# Patient Record
Sex: Female | Born: 1975 | Race: Black or African American | Hispanic: No | Marital: Single | Smoking: Current every day smoker
Health system: Southern US, Community
[De-identification: ages and names within clinical notes are randomized; demographics above are authoritative.]

## PROBLEM LIST (undated history)

## (undated) DIAGNOSIS — N760 Acute vaginitis: Secondary | ICD-10-CM

## (undated) DIAGNOSIS — I10 Essential (primary) hypertension: Secondary | ICD-10-CM

## (undated) DIAGNOSIS — B9689 Other specified bacterial agents as the cause of diseases classified elsewhere: Secondary | ICD-10-CM

## (undated) HISTORY — DX: Acute vaginitis: N76.0

## (undated) HISTORY — DX: Other specified bacterial agents as the cause of diseases classified elsewhere: B96.89

---

## 1997-12-24 ENCOUNTER — Inpatient Hospital Stay (HOSPITAL_COMMUNITY): Admission: AD | Admit: 1997-12-24 | Discharge: 1997-12-24 | Payer: Self-pay | Admitting: *Deleted

## 1997-12-25 ENCOUNTER — Ambulatory Visit (HOSPITAL_COMMUNITY): Admission: RE | Admit: 1997-12-25 | Discharge: 1997-12-25 | Payer: Self-pay | Admitting: *Deleted

## 1998-02-24 ENCOUNTER — Inpatient Hospital Stay (HOSPITAL_COMMUNITY): Admission: AD | Admit: 1998-02-24 | Discharge: 1998-02-24 | Payer: Self-pay | Admitting: *Deleted

## 2002-04-07 ENCOUNTER — Other Ambulatory Visit: Admission: RE | Admit: 2002-04-07 | Discharge: 2002-04-07 | Payer: Self-pay | Admitting: Obstetrics and Gynecology

## 2002-04-17 ENCOUNTER — Emergency Department (HOSPITAL_COMMUNITY): Admission: EM | Admit: 2002-04-17 | Discharge: 2002-04-17 | Payer: Self-pay | Admitting: Emergency Medicine

## 2002-04-17 ENCOUNTER — Encounter: Payer: Self-pay | Admitting: Emergency Medicine

## 2003-01-01 ENCOUNTER — Encounter: Admission: RE | Admit: 2003-01-01 | Discharge: 2003-01-01 | Payer: Self-pay | Admitting: Obstetrics and Gynecology

## 2003-01-01 ENCOUNTER — Encounter: Payer: Self-pay | Admitting: Obstetrics and Gynecology

## 2003-08-03 ENCOUNTER — Other Ambulatory Visit: Admission: RE | Admit: 2003-08-03 | Discharge: 2003-08-03 | Payer: Self-pay | Admitting: Obstetrics and Gynecology

## 2006-06-22 ENCOUNTER — Inpatient Hospital Stay (HOSPITAL_COMMUNITY): Admission: AD | Admit: 2006-06-22 | Discharge: 2006-06-22 | Payer: Self-pay | Admitting: Family Medicine

## 2006-08-18 ENCOUNTER — Emergency Department (HOSPITAL_COMMUNITY): Admission: EM | Admit: 2006-08-18 | Discharge: 2006-08-18 | Payer: Self-pay | Admitting: Emergency Medicine

## 2008-08-31 DIAGNOSIS — B9689 Other specified bacterial agents as the cause of diseases classified elsewhere: Secondary | ICD-10-CM

## 2008-08-31 HISTORY — DX: Other specified bacterial agents as the cause of diseases classified elsewhere: B96.89

## 2009-04-29 ENCOUNTER — Ambulatory Visit: Payer: Self-pay | Admitting: Internal Medicine

## 2009-04-29 ENCOUNTER — Encounter: Payer: Self-pay | Admitting: Internal Medicine

## 2009-04-29 DIAGNOSIS — R197 Diarrhea, unspecified: Secondary | ICD-10-CM | POA: Insufficient documentation

## 2009-04-29 LAB — CONVERTED CEMR LAB
ALT: 9 units/L (ref 0–35)
AST: 13 units/L (ref 0–37)
Albumin: 4.1 g/dL (ref 3.5–5.2)
Alkaline Phosphatase: 53 units/L (ref 39–117)
BUN: 10 mg/dL (ref 6–23)
CO2: 22 meq/L (ref 19–32)
Calcium: 9.2 mg/dL (ref 8.4–10.5)
Chloride: 109 meq/L (ref 96–112)
Cholesterol: 178 mg/dL (ref 0–200)
Creatinine, Ser: 0.86 mg/dL (ref 0.40–1.20)
Glucose, Bld: 72 mg/dL (ref 70–99)
HDL: 36 mg/dL — ABNORMAL LOW (ref >39)
LDL Cholesterol: 129 mg/dL — ABNORMAL HIGH (ref 0–99)
Potassium: 4.7 meq/L (ref 3.5–5.3)
Sodium: 141 meq/L (ref 135–145)
Total Bilirubin: 0.5 mg/dL (ref 0.3–1.2)
Total CHOL/HDL Ratio: 4.9
Total Protein: 7 g/dL (ref 6.0–8.3)
Triglycerides: 65 mg/dL (ref <150)
VLDL: 13 mg/dL (ref 0–40)

## 2009-04-30 ENCOUNTER — Encounter: Payer: Self-pay | Admitting: Internal Medicine

## 2009-06-17 ENCOUNTER — Ambulatory Visit: Payer: Self-pay | Admitting: Internal Medicine

## 2009-06-17 ENCOUNTER — Encounter: Payer: Self-pay | Admitting: Internal Medicine

## 2009-06-17 DIAGNOSIS — N760 Acute vaginitis: Secondary | ICD-10-CM | POA: Insufficient documentation

## 2009-06-20 LAB — CONVERTED CEMR LAB
Candida species: NEGATIVE
Gardnerella vaginalis: POSITIVE — AB

## 2011-01-21 ENCOUNTER — Other Ambulatory Visit (HOSPITAL_COMMUNITY)
Admission: RE | Admit: 2011-01-21 | Discharge: 2011-01-21 | Disposition: A | Discharge status: Home / Self Care | Payer: Self-pay | Source: Ambulatory Visit | Attending: Internal Medicine | Admitting: Internal Medicine

## 2011-01-21 ENCOUNTER — Ambulatory Visit (INDEPENDENT_AMBULATORY_CARE_PROVIDER_SITE_OTHER): Payer: Self-pay | Admitting: Internal Medicine

## 2011-01-21 ENCOUNTER — Encounter: Payer: Self-pay | Admitting: Internal Medicine

## 2011-01-21 DIAGNOSIS — Z01419 Encounter for gynecological examination (general) (routine) without abnormal findings: Secondary | ICD-10-CM | POA: Insufficient documentation

## 2011-01-21 DIAGNOSIS — Z23 Encounter for immunization: Secondary | ICD-10-CM

## 2011-01-21 DIAGNOSIS — Z124 Encounter for screening for malignant neoplasm of cervix: Secondary | ICD-10-CM

## 2011-01-21 NOTE — Assessment & Plan Note (Addendum)
No complaints Pap done, f/u results CMET and lipids done 1 1/2 years ago.  Will defer until next visit to recheck.

## 2011-01-21 NOTE — Patient Instructions (Signed)
Follow up in 1 year for routine visit and Pap smear. If you have any problems before your next visit, call clinic for an appointment. Will give tetanus booster and do Pap smear, will notify you if there are any problems.

## 2011-01-21 NOTE — Assessment & Plan Note (Signed)
Reports last tetanus in 2002, will boost today

## 2011-01-21 NOTE — Progress Notes (Signed)
  Subjective:    Patient ID: Victoria Carpenter, female    DOB: 05-25-1976, 35 y.o.   MRN: [id number]  HPI Here for routine visit and Pap smear.  No complaints aside from occasional pain with wisdom tooth that resolves with ibuprofen.     Review of Systems  Constitutional: Negative for activity change, appetite change and unexpected weight change.  Respiratory: Negative for chest tightness and shortness of breath.   Cardiovascular: Negative for palpitations.  Genitourinary: Negative for dysuria, vaginal discharge, genital sores, vaginal pain and pelvic pain.       Objective:   Physical Exam  Constitutional: She is oriented to person, place, and time. She appears well-developed and well-nourished. No distress.  HENT:  Head: Normocephalic and atraumatic.  Eyes: Conjunctivae and EOM are normal. Pupils are equal, round, and reactive to light. Right eye exhibits no discharge. Left eye exhibits no discharge. No scleral icterus.  Neck: Neck supple. No JVD present.  Cardiovascular: Normal rate, regular rhythm and normal heart sounds.  Exam reveals no gallop and no friction rub.   No murmur heard. Pulmonary/Chest: Effort normal and breath sounds normal. She has no wheezes. She has no rales.  Abdominal: Bowel sounds are normal.  Genitourinary: Vaginal discharge found.       Small amount of white discharge noted in vault.  Cervical os red but not erythematous.  No tenderness, no lesions.  Musculoskeletal: She exhibits no edema.  Neurological: She is alert and oriented to person, place, and time.  Psychiatric: She has a normal mood and affect. Her behavior is normal. Judgment and thought content normal.          Assessment & Plan:

## 2013-10-12 ENCOUNTER — Emergency Department (HOSPITAL_COMMUNITY): Payer: Self-pay

## 2013-10-12 ENCOUNTER — Encounter (HOSPITAL_COMMUNITY): Payer: Self-pay | Admitting: Emergency Medicine

## 2013-10-12 ENCOUNTER — Emergency Department (HOSPITAL_COMMUNITY)
Admission: EM | Admit: 2013-10-12 | Discharge: 2013-10-12 | Disposition: A | Discharge status: Home / Self Care | Payer: Self-pay | Attending: Emergency Medicine | Admitting: Emergency Medicine

## 2013-10-12 DIAGNOSIS — F17200 Nicotine dependence, unspecified, uncomplicated: Secondary | ICD-10-CM | POA: Insufficient documentation

## 2013-10-12 DIAGNOSIS — R079 Chest pain, unspecified: Secondary | ICD-10-CM

## 2013-10-12 DIAGNOSIS — R110 Nausea: Secondary | ICD-10-CM | POA: Insufficient documentation

## 2013-10-12 DIAGNOSIS — Z8742 Personal history of other diseases of the female genital tract: Secondary | ICD-10-CM | POA: Insufficient documentation

## 2013-10-12 DIAGNOSIS — R1011 Right upper quadrant pain: Secondary | ICD-10-CM | POA: Insufficient documentation

## 2013-10-12 DIAGNOSIS — Z79899 Other long term (current) drug therapy: Secondary | ICD-10-CM | POA: Insufficient documentation

## 2013-10-12 DIAGNOSIS — R072 Precordial pain: Secondary | ICD-10-CM | POA: Insufficient documentation

## 2013-10-12 DIAGNOSIS — R0602 Shortness of breath: Secondary | ICD-10-CM | POA: Insufficient documentation

## 2013-10-12 DIAGNOSIS — R1013 Epigastric pain: Secondary | ICD-10-CM | POA: Insufficient documentation

## 2013-10-12 DIAGNOSIS — Z8619 Personal history of other infectious and parasitic diseases: Secondary | ICD-10-CM | POA: Insufficient documentation

## 2013-10-12 DIAGNOSIS — I10 Essential (primary) hypertension: Secondary | ICD-10-CM | POA: Insufficient documentation

## 2013-10-12 DIAGNOSIS — R12 Heartburn: Secondary | ICD-10-CM | POA: Insufficient documentation

## 2013-10-12 HISTORY — DX: Essential (primary) hypertension: I10

## 2013-10-12 LAB — COMPREHENSIVE METABOLIC PANEL
ALT: 6 U/L (ref 0–35)
AST: 11 U/L (ref 0–37)
Albumin: 3.4 g/dL — ABNORMAL LOW (ref 3.5–5.2)
Alkaline Phosphatase: 67 U/L (ref 39–117)
BUN: 8 mg/dL (ref 6–23)
CALCIUM: 8.7 mg/dL (ref 8.4–10.5)
CO2: 24 mEq/L (ref 19–32)
Chloride: 103 mEq/L (ref 96–112)
Creatinine, Ser: 0.82 mg/dL (ref 0.50–1.10)
GFR calc Af Amer: >90 mL/min (ref >90)
GFR calc non Af Amer: 89 mL/min — ABNORMAL LOW (ref >90)
Glucose, Bld: 107 mg/dL — ABNORMAL HIGH (ref 70–99)
POTASSIUM: 3.9 meq/L (ref 3.7–5.3)
Sodium: 137 mEq/L (ref 137–147)
TOTAL PROTEIN: 6.9 g/dL (ref 6.0–8.3)
Total Bilirubin: 0.4 mg/dL (ref 0.3–1.2)

## 2013-10-12 LAB — CBC
HCT: 37.1 % (ref 36.0–46.0)
Hemoglobin: 12.9 g/dL (ref 12.0–15.0)
MCH: 32 pg (ref 26.0–34.0)
MCHC: 34.8 g/dL (ref 30.0–36.0)
MCV: 92.1 fL (ref 78.0–100.0)
Platelets: 209 10*3/uL (ref 150–400)
RBC: 4.03 MIL/uL (ref 3.87–5.11)
RDW: 12.8 % (ref 11.5–15.5)
WBC: 8 10*3/uL (ref 4.0–10.5)

## 2013-10-12 LAB — LIPASE, BLOOD: LIPASE: 27 U/L (ref 11–59)

## 2013-10-12 LAB — POCT I-STAT TROPONIN I: Troponin i, poc: 0.01 ng/mL (ref 0.00–0.08)

## 2013-10-12 MED ORDER — GI COCKTAIL ~~LOC~~
30.0000 mL | Freq: Once | ORAL | Status: AC
  Administered 2013-10-12: 30 mL via ORAL
  Filled 2013-10-12: qty 30

## 2013-10-12 MED ORDER — FAMOTIDINE 20 MG PO TABS: 20.0000 mg | ORAL_TABLET | Freq: Two times a day (BID) | ORAL | Status: DC

## 2013-10-12 NOTE — ED Provider Notes (Signed)
Medical screening examination/treatment/procedure(s) were performed by non-physician practitioner and as supervising physician I was immediately available for consultation/collaboration.  Judy Goodenow, MD 10/12/13 0633 

## 2013-10-12 NOTE — ED Provider Notes (Signed)
CSN: [id number]     Arrival date & time 10/12/13  0222 History   First MD Initiated Contact with Patient 10/12/13 [phone]     Chief Complaint  Patient presents with  . Chest Pain     (Consider location/radiation/quality/duration/timing/severity/associated sxs/prior Treatment) HPI Comments: Patient is a 38 year old female with past medical history of hypertension who presents to the emergency department complaining of midepigastric pain radiating to her mid sternal region x2 days. Patient states pain began initially after eating, tried taking Pepto-Bismol with minimal relief. Pain eventually subsided on its own, however returned again Yesterday without eating and became constant. Patient describes her pain as a burning sensation, severe. Nothing in specific makes her pain worse or better. Admits to slight nausea. She had an episode of shortness of breath earlier today. Denies fever, chills, headache, dizziness, lightheadedness, vision changes, vomiting, diaphoresis. No family history of early heart disease. No history of blood clots.  Patient is a 38 y.o. female presenting with chest pain. The history is provided by the patient.  Chest Pain Associated symptoms: abdominal pain, nausea and shortness of breath     Past Medical History  Diagnosis Date  . Vaginal delivery     x 2  . Vaginitis due to Gardnerella vaginalis 2010  . Hypertension    History reviewed. No pertinent past surgical history. Family History  Problem Relation Age of Onset  . Hypertension Mother   . Hypertension Father    History  Substance Use Topics  . Smoking status: Current Every Day Smoker -- 0.50 packs/day    Types: Cigarettes  . Smokeless tobacco: Not on file     Comment: afraid she will gain weight  . Alcohol Use: No   OB History   Grav Para Term Preterm Abortions TAB SAB Ect Mult Living                 Review of Systems  Respiratory: Positive for shortness of breath.   Cardiovascular: Positive for  chest pain.  Gastrointestinal: Positive for nausea and abdominal pain.  All other systems reviewed and are negative.      Allergies  Review of patient's allergies indicates no known allergies.  Home Medications   Current Outpatient Rx  Name  Route  Sig  Dispense  Refill  . bismuth subsalicylate (PEPTO BISMOL) 262 MG/15ML suspension   Oral   Take 30 mLs by mouth every 6 (six) hours as needed.         Marland Kitchen ibuprofen (ADVIL,MOTRIN) 200 MG tablet   Oral   Take 200 mg by mouth every 6 (six) hours as needed.         . famotidine (PEPCID) 20 MG tablet   Oral   Take 1 tablet (20 mg total) by mouth 2 (two) times daily.   30 tablet   0    BP 148/96  Pulse 80  Temp(Src) 98.8 F (37.1 C) (Oral)  Resp 18  Ht 5\' 7"  (1.702 m)  Wt 170 lb (77.111 kg)  BMI 26.62 kg/m2  SpO2 99%  LMP 09/30/2013 Physical Exam  Nursing note and vitals reviewed. Constitutional: She is oriented to person, place, and time. She appears well-developed and well-nourished. No distress.  HENT:  Head: Normocephalic and atraumatic.  Mouth/Throat: Oropharynx is clear and moist.  Eyes: Conjunctivae and EOM are normal. Pupils are equal, round, and reactive to light.  Neck: Normal range of motion. Neck supple. No JVD present.  Cardiovascular: Normal rate, regular rhythm, normal heart sounds and intact  distal pulses.   No extremity edema. Equal distal pulses.  Pulmonary/Chest: Effort normal and breath sounds normal.  Abdominal: Soft. Normal appearance and bowel sounds are normal. She exhibits no distension. There is tenderness in the right upper quadrant and epigastric area. There is no rigidity, no rebound, no guarding and negative Murphy's sign.  Musculoskeletal: Normal range of motion. She exhibits no edema.  Neurological: She is alert and oriented to person, place, and time. She has normal strength. No sensory deficit.  Moves limbs without ataxia. Speech fluent, goal oriented. Equal grip strength.  Skin:  Skin is warm and dry. She is not diaphoretic.  Psychiatric: She has a normal mood and affect. Her behavior is normal.    ED Course  Procedures (including critical care time) Labs Review Labs Reviewed  COMPREHENSIVE METABOLIC PANEL - Abnormal; Notable for the following:    Glucose, Bld 107 (*)    Albumin 3.4 (*)    GFR calc non Af Amer 89 (*)    All other components within normal limits  CBC  LIPASE, BLOOD  POCT I-STAT TROPONIN I   Imaging Review Dg Chest 2 View  10/12/2013   CLINICAL DATA:  Upper chest pain.  EXAM: CHEST  2 VIEW  COMPARISON:  DG THORACIC SPINE dated 08/18/2006  FINDINGS: Cardiopericardial silhouette within normal limits. Mediastinal contours normal. Trachea midline. No airspace disease or effusion. No pneumothorax. Monitoring leads project over the chest.  IMPRESSION: No active cardiopulmonary disease.   Electronically Signed   By: Andreas NewportGeoffrey  Lamke M.D.   On: 10/12/2013 04:44   [place] Abdomen Complete  10/12/2013   CLINICAL DATA:  Epigastric pain radiating to the abdomen.  EXAM: ULTRASOUND ABDOMEN COMPLETE  COMPARISON:  None.  FINDINGS: Gallbladder:  Multiple small folds are present within the gallbladder. There are no stones. No sonographic Murphy sign, para cholecystic fluid or wall thickening.  Common bile duct:  Diameter: Normal at 3 mm.  Liver:  No focal lesion identified. Within normal limits in parenchymal echogenicity.  IVC:  No abnormality visualized.  Pancreas:  Visualized portion unremarkable.  Spleen:  6 cm.  Normal echotexture.  Right Kidney:  Length: 10.8 cm. Echogenicity within normal limits. No mass or hydronephrosis visualized.  Left Kidney:  Length: 10.0 cm. Echogenicity within normal limits. No mass or hydronephrosis visualized.  Abdominal aorta:  No aneurysm visualized.  Other findings:  None.  IMPRESSION: 1. Negative for cholelithiasis or cholecystitis. 2. No acute abnormality.   Electronically Signed   By: Andreas NewportGeoffrey  Lamke M.D.   On: 10/12/2013 05:32    EKG  Interpretation    Date/Time:  Thursday October 12 2013 02:32:18 EST Ventricular Rate:  77 PR Interval:  187 QRS Duration: 78 QT Interval:  368 QTC Calculation: 416 R Axis:   37 Text Interpretation:  Sinus rhythm Nonspecific ST abnormality No old tracing to compare Confirmed by OPITZ  MD, BRIAN ([id number]) on 10/12/2013 4:14:04 AM           Date: 10/12/2013  Rate: 77  Rhythm: normal sinus rhythm  QRS Axis: normal  Intervals: normal  ST/T Wave abnormalities: normal  Conduction Disutrbances:none  Narrative Interpretation: Sinus rhythm, probable left atrial enlargement, probable left ventricular hypertrophy  Old EKG Reviewed: none available    MDM   Final diagnoses:  Heartburn  Chest pain    Patient presenting with midepigastric pain and midsternal chest pain with associated nausea. She is well appearing and in no apparent distress. Afebrile, vital signs stable. EKG showing sinus rhythm, probable left  atrial enlargement, probable left ventricular hypertrophy. Patient tender midepigastric and right upper quadrant abdomen. No history of abdominal surgeries. Labs pending. Will obtain abdominal ultrasound to rule out gallbladder cause. Will do chest pain workup. 5:43 AM Labs, x-ray and ultrasound without acute finding. Patient reports some improvement of her symptoms with GI cocktail. Low risk heart score 1. PERC negative. Symptoms most likely from heartburn/indigestion. Pt stable for discharge home. Will discharge with pepcid. F/u with PCP. Return precautions given. Patient states understanding of treatment care plan and is agreeable.      Trevor Mace, PA-C 10/12/13 [phone]

## 2013-10-12 NOTE — ED Notes (Signed)
Pt c/o epigastric pain radiating to back x 2 days, intermittent with SHOB tonight. Denies n/v/d, describes as burning.

## 2013-10-12 NOTE — Discharge Instructions (Signed)
Take Pepcid as directed for indigestion. You may also use antacids.  Chest Pain (Nonspecific) It is often hard to give a specific diagnosis for the cause of chest pain. There is always a chance that your pain could be related to something serious, such as a heart attack or a blood clot in the lungs. You need to follow up with your caregiver for further evaluation. CAUSES   Heartburn.  Pneumonia or bronchitis.  Anxiety or stress.  Inflammation around your heart (pericarditis) or lung (pleuritis or pleurisy).  A blood clot in the lung.  A collapsed lung (pneumothorax). It can develop suddenly on its own (spontaneous pneumothorax) or from injury (trauma) to the chest.  Shingles infection (herpes zoster virus). The chest wall is composed of bones, muscles, and cartilage. Any of these can be the source of the pain.  The bones can be bruised by injury.  The muscles or cartilage can be strained by coughing or overwork.  The cartilage can be affected by inflammation and become sore (costochondritis). DIAGNOSIS  Lab tests or other studies, such as X-rays, electrocardiography, stress testing, or cardiac imaging, may be needed to find the cause of your pain.  TREATMENT   Treatment depends on what may be causing your chest pain. Treatment may include:  Acid blockers for heartburn.  Anti-inflammatory medicine.  Pain medicine for inflammatory conditions.  Antibiotics if an infection is present.  You may be advised to change lifestyle habits. This includes stopping smoking and avoiding alcohol, caffeine, and chocolate.  You may be advised to keep your head raised (elevated) when sleeping. This reduces the chance of acid going backward from your stomach into your esophagus.  Most of the time, nonspecific chest pain will improve within 2 to 3 days with rest and mild pain medicine. HOME CARE INSTRUCTIONS   If antibiotics were prescribed, take your antibiotics as directed. Finish them  even if you start to feel better.  For the next few days, avoid physical activities that bring on chest pain. Continue physical activities as directed.  Do not smoke.  Avoid drinking alcohol.  Only take over-the-counter or prescription medicine for pain, discomfort, or fever as directed by your caregiver.  Follow your caregiver's suggestions for further testing if your chest pain does not go away.  Keep any follow-up appointments you made. If you do not go to an appointment, you could develop lasting (chronic) problems with pain. If there is any problem keeping an appointment, you must call to reschedule. SEEK MEDICAL CARE IF:   You think you are having problems from the medicine you are taking. Read your medicine instructions carefully.  Your chest pain does not go away, even after treatment.  You develop a rash with blisters on your chest. SEEK IMMEDIATE MEDICAL CARE IF:   You have increased chest pain or pain that spreads to your arm, neck, jaw, back, or abdomen.  You develop shortness of breath, an increasing cough, or you are coughing up blood.  You have severe back or abdominal pain, feel nauseous, or vomit.  You develop severe weakness, fainting, or chills.  You have a fever. THIS IS AN EMERGENCY. Do not wait to see if the pain will go away. Get medical help at once. Call your local emergency services (911 in [place] Do not drive yourself to the hospital. MAKE SURE YOU:   Understand these instructions.  Will watch your condition.  Will get help right away if you are not doing well or get worse. Document Released:  05/27/2005 Document Revised: 11/09/2011 Document Reviewed: 03/22/2008 [facility]ExitCare Patient Information 2014 [place], [place].  Heartburn Heartburn is a painful, burning sensation in the chest. It may feel worse in certain positions, such as lying down or bending over. It is caused by stomach acid backing up into the tube that carries food from the mouth down to  the stomach (lower esophagus).  CAUSES   Large meals.  Certain foods and drinks.  Exercise.  Increased acid production.  Being overweight or obese.  Certain medicines. SYMPTOMS   Burning pain in the chest or lower throat.  Bitter taste in the mouth.  Coughing. DIAGNOSIS  If the usual treatments for heartburn do not improve your symptoms, then tests may be done to see if there is another condition present. Possible tests may include:  X-rays.  Endoscopy. This is when a tube with a light and a camera on the end is used to examine the esophagus and the stomach.  A test to measure the amount of acid in the esophagus (pH test).  A test to see if the esophagus is working properly (esophageal manometry).  Blood, breath, or stool tests to check for bacteria that cause ulcers. TREATMENT   Your caregiver may tell you to use certain over-the-counter medicines (antacids, acid reducers) for mild heartburn.  Your caregiver may prescribe medicines to decrease the acid in your stomach or protect your stomach lining.  Your caregiver may recommend certain diet changes.  For severe cases, your caregiver may recommend that the head of your bed be elevated on blocks. (Sleeping with more pillows is not an effective treatment as it only changes the position of your head and does not improve the main problem of stomach acid refluxing into the esophagus.) HOME CARE INSTRUCTIONS   Take all medicines as directed by your caregiver.  Raise the head of your bed by putting blocks under the legs if instructed to by your caregiver.  Do not exercise right after eating.  Avoid eating 2 or 3 hours before bed. Do not lie down right after eating.  Eat small meals throughout the day instead of 3 large meals.  Stop smoking if you smoke.  Maintain a healthy weight.  Identify foods and beverages that make your symptoms worse and avoid them. Foods you may want to avoid  include:  Peppers.  Chocolate.  High-fat foods, including fried foods.  Spicy foods.  Garlic and onions.  Citrus fruits, including oranges, grapefruit, lemons, and limes.  Food containing tomatoes or tomato products.  Mint.  Carbonated drinks, caffeinated drinks, and alcohol.  Vinegar. SEEK IMMEDIATE MEDICAL CARE IF:  You have severe chest pain that goes down your arm or into your jaw or neck.  You feel sweaty, dizzy, or lightheaded.  You are short of breath.  You vomit blood.  You have difficulty or pain with swallowing.  You have bloody or black, tarry stools.  You have episodes of heartburn more than 3 times a week for more than 2 weeks. MAKE SURE YOU:  Understand these instructions.  Will watch your condition.  Will get help right away if you are not doing well or get worse. Document Released: 01/03/2009 Document Revised: 11/09/2011 Document Reviewed: 02/01/2011 [facility] Patient Information 2014 [place], [place].

## 2015-02-20 ENCOUNTER — Ambulatory Visit: Payer: Self-pay | Admitting: Family Medicine

## 2015-06-07 ENCOUNTER — Ambulatory Visit (INDEPENDENT_AMBULATORY_CARE_PROVIDER_SITE_OTHER): Payer: Self-pay | Admitting: Family Medicine

## 2015-06-07 ENCOUNTER — Encounter: Payer: Self-pay | Admitting: Family Medicine

## 2015-06-07 VITALS — BP 158/98 | HR 86 | Temp 98.3°F | Resp 16 | Ht 66.0 in | Wt 201.0 lb

## 2015-06-07 DIAGNOSIS — Z0000 Encounter for general adult medical examination without abnormal findings: Secondary | ICD-10-CM

## 2015-06-07 DIAGNOSIS — F17200 Nicotine dependence, unspecified, uncomplicated: Secondary | ICD-10-CM

## 2015-06-07 DIAGNOSIS — I10 Essential (primary) hypertension: Secondary | ICD-10-CM

## 2015-06-07 LAB — POCT URINALYSIS DIP (DEVICE)
BILIRUBIN URINE: NEGATIVE
GLUCOSE, UA: NEGATIVE mg/dL
Ketones, ur: NEGATIVE mg/dL
Nitrite: NEGATIVE
Protein, ur: NEGATIVE mg/dL
Specific Gravity, Urine: 1.025 (ref 1.005–1.030)
UROBILINOGEN UA: 0.2 mg/dL (ref 0.0–1.0)
pH: 5.5 (ref 5.0–8.0)

## 2015-06-07 LAB — COMPLETE METABOLIC PANEL WITH GFR
ALK PHOS: 64 U/L (ref 33–115)
ALT: 9 U/L (ref 6–29)
AST: 11 U/L (ref 10–30)
Albumin: 4.3 g/dL (ref 3.6–5.1)
BUN: 9 mg/dL (ref 7–25)
CO2: 25 mmol/L (ref 20–31)
CREATININE: 0.76 mg/dL (ref 0.50–1.10)
Calcium: 9.2 mg/dL (ref 8.6–10.2)
Chloride: 104 mmol/L (ref 98–110)
GFR, Est African American: >89 mL/min (ref >=60)
GFR, Est Non African American: >89 mL/min (ref >=60)
Glucose, Bld: 75 mg/dL (ref 65–99)
Potassium: 4.6 mmol/L (ref 3.5–5.3)
Sodium: 138 mmol/L (ref 135–146)
Total Bilirubin: 0.5 mg/dL (ref 0.2–1.2)
Total Protein: 7.2 g/dL (ref 6.1–8.1)

## 2015-06-07 LAB — LIPID PANEL
CHOLESTEROL: 192 mg/dL (ref 125–200)
HDL: 37 mg/dL — ABNORMAL LOW (ref >=46)
LDL Cholesterol: 122 mg/dL (ref <130)
Total CHOL/HDL Ratio: 5.2 Ratio — ABNORMAL HIGH (ref <=5.0)
Triglycerides: 164 mg/dL — ABNORMAL HIGH (ref <150)
VLDL: 33 mg/dL — ABNORMAL HIGH (ref <30)

## 2015-06-07 LAB — HEMOGLOBIN A1C
Hgb A1c MFr Bld: 5.4 % (ref <5.7)
Mean Plasma Glucose: 108 mg/dL (ref <117)

## 2015-06-07 MED ORDER — AMLODIPINE BESYLATE 5 MG PO TABS: 5.0000 mg | ORAL_TABLET | Freq: Every day | ORAL | Status: DC

## 2015-06-07 NOTE — Progress Notes (Signed)
Subjective:    Patient ID: [name], female    DOB: 1976-06-25, 39 y.o.   MRN: [id number]  HPI Ms. Kimla Furth, a 39 year old female with a history of hypertension presents to establish care. She states that she was previously a patient of Deneen Harts, FNP at [facility] in [place], [place]. Patient states that she has not followed up due to insurance constraints and has not taken medications in several months. She maintains that she does not exercise or follow a low sodium diet. She states that she is an everyday tobacco smoker that smokes .5 packs per day. She currently denies headache, shortness of breath, dizziness, chest pains, fatigue, edema, or heart palpitations.   Past Medical History  Diagnosis Date  . Vaginal delivery     x 2  . Vaginitis due to Gardnerella vaginalis 2010  . Hypertension    Immunization History  Administered Date(s) Administered  . Td 01/21/2011   Social History   Social History  . Marital Status: Single    Spouse Name: N/A  . Number of Children: N/A  . Years of Education: N/A   Occupational History  . Not on file.   Social History Main Topics  . Smoking status: Current Every Day Smoker -- 0.50 packs/day    Types: Cigarettes  . Smokeless tobacco: Not on file     Comment: afraid she will gain weight  . Alcohol Use: No  . Drug Use: No  . Sexual Activity: Yes    Birth Control/ Protection: Condom   Other Topics Concern  . Not on file   Social History Narrative   Home Health, private sitter   Review of Systems  Constitutional: Negative.  Negative for fever and fatigue.  HENT: Negative.   Eyes: Negative.   Respiratory: Negative.   Cardiovascular: Negative.   Gastrointestinal: Negative.   Endocrine: Negative.   Genitourinary: Negative.   Musculoskeletal: Negative.   Skin: Negative.   Allergic/Immunologic: Negative.   Neurological: Negative.   Hematological: Negative.   Psychiatric/Behavioral: Negative.         Objective:   Physical Exam  Constitutional: She is oriented to person, place, and time. She appears well-developed and well-nourished.  HENT:  Head: Normocephalic and atraumatic.  Right Ear: External ear normal.  Left Ear: External ear normal.  Nose: Nose normal.  Mouth/Throat: Uvula is midline, oropharynx is clear and moist and mucous membranes are normal. Abnormal dentition. Dental caries present.  Eyes: Conjunctivae and EOM are normal. Pupils are equal, round, and reactive to light.  Neck: Normal range of motion. Neck supple.  Cardiovascular: Normal rate, regular rhythm, normal heart sounds and intact distal pulses.   Pulmonary/Chest: Effort normal and breath sounds normal.  Abdominal: Soft. Bowel sounds are normal.  Musculoskeletal: Normal range of motion.  Neurological: She is alert and oriented to person, place, and time. She has normal reflexes.  Skin: Skin is warm and dry.  Psychiatric: She has a normal mood and affect. Her behavior is normal. Judgment and thought content normal.         BP 158/98 mmHg  Pulse 86  Temp(Src) 98.3 F (36.8 C) (Oral)  Resp 16  Ht  (1.676 m)  Wt 201 lb (91.173 kg)  BMI 32.46 kg/m2  LMP 05/20/2015 Assessment & Plan:    1. Essential hypertension Blood pressure is above goal. Will start amlodipine today. Will follow up for hypertension in 1 month after starting medication. Recommend a lowfat, low carbohydrate diet divided  over 5-6 small meals, increase water intake to 6-8 glasses, and 150 minutes per week of cardiovascular exercise.  - amLODipine (NORVASC) 5 MG tablet; Take 1 tablet (5 mg total) by mouth daily.  Dispense: 30 tablet; Refill: 0 - POCT urinalysis dipstick - COMPLETE METABOLIC PANEL WITH GFR  2. Tobacco dependence Smoking cessation instruction/counseling given:  counseled patient on the dangers of tobacco use, advised patient to stop smoking, and reviewed strategies to maximize success  3. Routine health  maintenance Eye exam: 3 years ago. Recommend a yearly eye exam  Dental visit: greater than 2 year Pap smear: 2014, normal per patient Patient refuses vaccinations - Lipid Panel - Hemoglobin A1c - MM Digital Screening; Future    Massie Maroon, FNP

## 2015-06-07 NOTE — Patient Instructions (Signed)
DASH Eating Plan °DASH stands for "Dietary Approaches to Stop Hypertension." The DASH eating plan is a healthy eating plan that has been shown to reduce high blood pressure (hypertension). Additional health benefits may include reducing the risk of type 2 diabetes mellitus, heart disease, and stroke. The DASH eating plan may also help with weight loss. °WHAT DO I NEED TO KNOW ABOUT THE DASH EATING PLAN? °For the DASH eating plan, you will follow these general guidelines: °· Choose foods with a percent daily value for sodium of less than 5% (as listed on the food label). °· Use salt-free seasonings or herbs instead of table salt or sea salt. °· Check with your health care provider or pharmacist before using salt substitutes. °· Eat lower-sodium products, often labeled as "lower sodium" or "no salt added." °· Eat fresh foods. °· Eat more vegetables, fruits, and low-fat dairy products. °· Choose whole grains. Look for the word "whole" as the first word in the ingredient list. °· Choose fish and skinless chicken or turkey more often than red meat. Limit fish, poultry, and meat to 6 oz (170 g) each day. °· Limit sweets, desserts, sugars, and sugary drinks. °· Choose heart-healthy fats. °· Limit cheese to 1 oz (28 g) per day. °· Eat more home-cooked food and less restaurant, buffet, and fast food. °· Limit fried foods. °· Cook foods using methods other than frying. °· Limit canned vegetables. If you do use them, rinse them well to decrease the sodium. °· When eating at a restaurant, ask that your food be prepared with less salt, or no salt if possible. °WHAT FOODS CAN I EAT? °Seek help from a dietitian for individual calorie needs. °Grains °Whole grain or whole wheat bread. Brown rice. Whole grain or whole wheat pasta. Quinoa, bulgur, and whole grain cereals. Low-sodium cereals. Corn or whole wheat flour tortillas. Whole grain cornbread. Whole grain crackers. Low-sodium crackers. °Vegetables °Fresh or frozen vegetables  (raw, steamed, roasted, or grilled). Low-sodium or reduced-sodium tomato and vegetable juices. Low-sodium or reduced-sodium tomato sauce and paste. Low-sodium or reduced-sodium canned vegetables.  °Fruits °All fresh, canned (in natural juice), or frozen fruits. °Meat and Other Protein Products °Ground beef (85% or leaner), grass-fed beef, or beef trimmed of fat. Skinless chicken or turkey. Ground chicken or turkey. Pork trimmed of fat. All fish and seafood. Eggs. Dried beans, peas, or lentils. Unsalted nuts and seeds. Unsalted canned beans. °Dairy °Low-fat dairy products, such as skim or 1% milk, 2% or reduced-fat cheeses, low-fat ricotta or cottage cheese, or plain low-fat yogurt. Low-sodium or reduced-sodium cheeses. °Fats and Oils °Tub margarines without trans fats. Light or reduced-fat mayonnaise and salad dressings (reduced sodium). Avocado. Safflower, olive, or canola oils. Natural peanut or almond butter. °Other °Unsalted popcorn and pretzels. °The items listed above may not be a complete list of recommended foods or beverages. Contact your dietitian for more options. °WHAT FOODS ARE NOT RECOMMENDED? °Grains °White bread. White pasta. White rice. Refined cornbread. Bagels and croissants. Crackers that contain trans fat. °Vegetables °Creamed or fried vegetables. Vegetables in a cheese sauce. Regular canned vegetables. Regular canned tomato sauce and paste. Regular tomato and vegetable juices. °Fruits °Dried fruits. Canned fruit in light or heavy syrup. Fruit juice. °Meat and Other Protein Products °Fatty cuts of meat. Ribs, chicken wings, bacon, sausage, bologna, salami, chitterlings, fatback, hot dogs, bratwurst, and packaged luncheon meats. Salted nuts and seeds. Canned beans with salt. °Dairy °Whole or 2% milk, cream, half-and-half, and cream cheese. Whole-fat or sweetened yogurt. Full-fat   cheeses or blue cheese. Nondairy creamers and whipped toppings. Processed cheese, cheese spreads, or cheese  curds. °Condiments °Onion and garlic salt, seasoned salt, table salt, and sea salt. Canned and packaged gravies. Worcestershire sauce. Tartar sauce. Barbecue sauce. Teriyaki sauce. Soy sauce, including reduced sodium. Steak sauce. Fish sauce. Oyster sauce. Cocktail sauce. Horseradish. Ketchup and mustard. Meat flavorings and tenderizers. Bouillon cubes. Hot sauce. Tabasco sauce. Marinades. Taco seasonings. Relishes. °Fats and Oils °Butter, stick margarine, lard, shortening, ghee, and bacon fat. Coconut, palm kernel, or palm oils. Regular salad dressings. °Other °Pickles and olives. Salted popcorn and pretzels. °The items listed above may not be a complete list of foods and beverages to avoid. Contact your dietitian for more information. °WHERE CAN I FIND MORE INFORMATION? °[employer], [employer]: www.nhlbi.nih.gov/health/health-topics/topics/dash/ °  °This information is not intended to replace advice given to you by your health care provider. Make sure you discuss any questions you have with your health care provider. °  °Document Released: 08/06/2011 Document Revised: 09/07/2014 Document Reviewed: 06/21/2013 °Elsevier Interactive Patient Education ©2016 [employer] ° °Hypertension °Hypertension, commonly called high blood pressure, is when the force of blood pumping through your arteries is too strong. Your arteries are the blood vessels that carry blood from your heart throughout your body. A blood pressure reading consists of a higher number over a lower number, such as 110/72. The higher number (systolic) is the pressure inside your arteries when your heart pumps. The lower number (diastolic) is the pressure inside your arteries when your heart relaxes. Ideally you want your blood pressure below 120/80. °Hypertension forces your heart to work harder to pump blood. Your arteries may become narrow or stiff. Having untreated or uncontrolled hypertension can cause heart attack, stroke, kidney  disease, and other problems. °RISK FACTORS °Some risk factors for high blood pressure are controllable. Others are not.  °Risk factors you cannot control include:  °· Race. You may be at higher risk if you are African American. °· Age. Risk increases with age. °· Gender. Men are at higher risk than women before age 45 years. After age 65, women are at higher risk than men. °Risk factors you can control include: °· Not getting enough exercise or physical activity. °· Being overweight. °· Getting too much fat, sugar, calories, or salt in your diet. °· Drinking too much alcohol. °SIGNS AND SYMPTOMS °Hypertension does not usually cause signs or symptoms. Extremely high blood pressure (hypertensive crisis) may cause headache, anxiety, shortness of breath, and nosebleed. °DIAGNOSIS °To check if you have hypertension, your health care provider will measure your blood pressure while you are seated, with your arm held at the level of your heart. It should be measured at least twice using the same arm. Certain conditions can cause a difference in blood pressure between your right and left arms. A blood pressure reading that is higher than normal on one occasion does not mean that you need treatment. If it is not clear whether you have high blood pressure, you may be asked to return on a different day to have your blood pressure checked again. Or, you may be asked to monitor your blood pressure at home for 1 or more weeks. °TREATMENT °Treating high blood pressure includes making lifestyle changes and possibly taking medicine. Living a healthy lifestyle can help lower high blood pressure. You may need to change some of your habits. °Lifestyle changes may include: °· Following the DASH diet. This diet is high in fruits, vegetables, and whole   grains. It is low in salt, red meat, and added sugars. °· Keep your sodium intake below 2,300 mg per day. °· Getting at least 30-45 minutes of aerobic exercise at least 4 times per  week. °· Losing weight if necessary. °· Not smoking. °· Limiting alcoholic beverages. °· Learning ways to reduce stress. °Your health care provider may prescribe medicine if lifestyle changes are not enough to get your blood pressure under control, and if one of the following is true: °· You are 18-59 years of age and your systolic blood pressure is above 140. °· You are 60 years of age or older, and your systolic blood pressure is above 150. °· Your diastolic blood pressure is above 90. °· You have diabetes, and your systolic blood pressure is over 140 or your diastolic blood pressure is over 90. °· You have kidney disease and your blood pressure is above 140/90. °· You have heart disease and your blood pressure is above 140/90. °Your personal target blood pressure may vary depending on your medical conditions, your age, and other factors. °HOME CARE INSTRUCTIONS °· Have your blood pressure rechecked as directed by your health care provider.   °· Take medicines only as directed by your health care provider. Follow the directions carefully. Blood pressure medicines must be taken as prescribed. The medicine does not work as well when you skip doses. Skipping doses also puts you at risk for problems. °· Do not smoke.   °· Monitor your blood pressure at home as directed by your health care provider.  °SEEK MEDICAL CARE IF:  °· You think you are having a reaction to medicines taken. °· You have recurrent headaches or feel dizzy. °· You have swelling in your ankles. °· You have trouble with your vision. °SEEK IMMEDIATE MEDICAL CARE IF: °· You develop a severe headache or confusion. °· You have unusual weakness, numbness, or feel faint. °· You have severe chest or abdominal pain. °· You vomit repeatedly. °· You have trouble breathing. °MAKE SURE YOU:  °· Understand these instructions. °· Will watch your condition. °· Will get help right away if you are not doing well or get worse. °  °This information is not intended to  replace advice given to you by your health care provider. Make sure you discuss any questions you have with your health care provider. °  °Document Released: 08/17/2005 Document Revised: 01/01/2015 Document Reviewed: 06/09/2013 °Elsevier Interactive Patient Education ©2016 [employer] Inc. ° °

## 2015-06-09 DIAGNOSIS — F17200 Nicotine dependence, unspecified, uncomplicated: Secondary | ICD-10-CM | POA: Insufficient documentation

## 2015-07-04 ENCOUNTER — Telehealth: Payer: Self-pay | Admitting: Family Medicine

## 2015-07-08 ENCOUNTER — Ambulatory Visit: Payer: Self-pay | Admitting: Family Medicine

## 2015-07-16 ENCOUNTER — Ambulatory Visit (INDEPENDENT_AMBULATORY_CARE_PROVIDER_SITE_OTHER): Payer: Self-pay | Admitting: Family Medicine

## 2015-07-16 ENCOUNTER — Encounter: Payer: Self-pay | Admitting: Family Medicine

## 2015-07-16 VITALS — BP 152/94 | HR 61 | Temp 98.1°F | Resp 14 | Ht 68.0 in | Wt 205.0 lb

## 2015-07-16 DIAGNOSIS — I10 Essential (primary) hypertension: Secondary | ICD-10-CM

## 2015-07-16 DIAGNOSIS — F17200 Nicotine dependence, unspecified, uncomplicated: Secondary | ICD-10-CM

## 2015-07-16 LAB — POCT URINALYSIS DIP (DEVICE)
BILIRUBIN URINE: NEGATIVE
GLUCOSE, UA: NEGATIVE mg/dL
Ketones, ur: NEGATIVE mg/dL
LEUKOCYTES UA: NEGATIVE
Nitrite: NEGATIVE
Protein, ur: NEGATIVE mg/dL
Urobilinogen, UA: 1 mg/dL (ref 0.0–1.0)
pH: 6.5 (ref 5.0–8.0)

## 2015-07-16 MED ORDER — AMLODIPINE BESYLATE 10 MG PO TABS: 10.0000 mg | ORAL_TABLET | Freq: Every day | ORAL | Status: DC

## 2015-07-16 NOTE — Progress Notes (Signed)
Subjective:    Patient ID: Bernita BuffyWillette L Seufert, female    DOB: 1976-01-07, 39 y.o.   MRN: [id number]  Hypertension   Ms. Lenis DickinsonWillette Pasqua, a 39 year old female with a history of hypertension presents for a 1 month follow up after starting medication. She states that she has been consistently taking medication at noon. She works 3rd shift so she takes medication on awakening.  maintains that she does not exercise or follow a low sodium diet. She states that she is an everyday tobacco smoker that smokes .5 packs per day. She currently denies headache, shortness of breath, dizziness, chest pains, fatigue, edema, or heart palpitations.   Past Medical History  Diagnosis Date  . Vaginal delivery     x 2  . Vaginitis due to Gardnerella vaginalis 2010  . Hypertension    Immunization History  Administered Date(s) Administered  . Td 01/21/2011   Social History   Social History  . Marital Status: Single    Spouse Name: N/A  . Number of Children: N/A  . Years of Education: N/A   Occupational History  . Not on file.   Social History Main Topics  . Smoking status: Current Every Day Smoker -- 0.50 packs/day    Types: Cigarettes  . Smokeless tobacco: Not on file     Comment: afraid she will gain weight  . Alcohol Use: No  . Drug Use: No  . Sexual Activity: Yes    Birth Control/ Protection: Condom   Other Topics Concern  . Not on file   Social History Narrative   Home Health, private sitter   Review of Systems  Constitutional: Negative.  Negative for fever and fatigue.  HENT: Negative.   Eyes: Negative.   Respiratory: Negative.  Negative for wheezing.   Cardiovascular: Negative.   Gastrointestinal: Negative.   Endocrine: Negative.  Negative for polydipsia, polyphagia and polyuria.  Genitourinary: Negative.   Musculoskeletal: Negative.   Skin: Negative.   Allergic/Immunologic: Negative.  Negative for immunocompromised state.  Neurological: Negative.  Negative for  dizziness, tremors and weakness.  Hematological: Negative.   Psychiatric/Behavioral: Negative.        Objective:   Physical Exam  Constitutional: She is oriented to person, place, and time. She appears well-developed and well-nourished.  HENT:  Head: Normocephalic and atraumatic.  Right Ear: External ear normal.  Left Ear: External ear normal.  Nose: Nose normal.  Mouth/Throat: Uvula is midline, oropharynx is clear and moist and mucous membranes are normal. Abnormal dentition. Dental caries present.  Eyes: Conjunctivae and EOM are normal. Pupils are equal, round, and reactive to light.  Neck: Normal range of motion. Neck supple.  Cardiovascular: Normal rate, regular rhythm, normal heart sounds and intact distal pulses.   Pulmonary/Chest: Effort normal and breath sounds normal.  Abdominal: Soft. Bowel sounds are normal.  Musculoskeletal: Normal range of motion.  Neurological: She is alert and oriented to person, place, and time. She has normal reflexes.  Skin: Skin is warm and dry.  Psychiatric: She has a normal mood and affect. Her behavior is normal. Judgment and thought content normal.      BP 165/99 mmHg  Pulse 61  Temp(Src) 98.1 F (36.7 C) (Oral)  Resp 14  Ht 5\' 8"  (1.727 m)  Wt 205 lb (92.987 kg)  BMI 31.18 kg/m2  LMP 07/14/2015 Assessment & Plan:  1. Essential hypertension Blood pressure is above goal. Will increase Amlodipine to 10 mg today. . Will follow up for hypertension in 1 month  after starting medication. Recommend a lowfat, low carbohydrate diet divided over 5-6 small meals, increase water intake to 6-8 glasses, and 150 minutes per week of cardiovascular exercise.   - amLODipine (NORVASC) 10 MG tablet; Take 1 tablet (10 mg total) by mouth daily.  Dispense: 30 tablet; Refill: 0 - POCT urinalysis dipstick  2. Tobacco dependence Patient states that she has been cutting down on smoking since she started working 3rd shift. Smoking cessation instruction/counseling  given:  counseled patient on the dangers of tobacco use, advised patient to stop smoking, and reviewed strategies to maximize success  Massie Maroon, FNP

## 2015-09-16 ENCOUNTER — Ambulatory Visit: Payer: Self-pay | Admitting: Family Medicine

## 2017-09-22 ENCOUNTER — Ambulatory Visit: Payer: Self-pay | Admitting: Family Medicine

## 2017-09-23 ENCOUNTER — Encounter: Payer: Self-pay | Admitting: Family Medicine

## 2017-09-23 ENCOUNTER — Ambulatory Visit: Payer: BLUE CROSS/BLUE SHIELD | Admitting: Family Medicine

## 2017-09-23 VITALS — BP 144/98 | HR 84 | Temp 98.1°F | Resp 16 | Ht 67.0 in | Wt 208.0 lb

## 2017-09-23 DIAGNOSIS — I10 Essential (primary) hypertension: Secondary | ICD-10-CM

## 2017-09-23 DIAGNOSIS — E669 Obesity, unspecified: Secondary | ICD-10-CM

## 2017-09-23 DIAGNOSIS — Z114 Encounter for screening for human immunodeficiency virus [HIV]: Secondary | ICD-10-CM | POA: Diagnosis not present

## 2017-09-23 DIAGNOSIS — F17200 Nicotine dependence, unspecified, uncomplicated: Secondary | ICD-10-CM

## 2017-09-23 LAB — POCT URINALYSIS DIP (DEVICE)
Glucose, UA: NEGATIVE mg/dL
Leukocytes, UA: NEGATIVE
NITRITE: NEGATIVE
PH: 5.5 (ref 5.0–8.0)
Protein, ur: 30 mg/dL — AB
Specific Gravity, Urine: >=1.03 (ref 1.005–1.030)
Urobilinogen, UA: 1 mg/dL (ref 0.0–1.0)

## 2017-09-23 LAB — POCT GLYCOSYLATED HEMOGLOBIN (HGB A1C): Hemoglobin A1C: 5.4

## 2017-09-23 MED ORDER — AMLODIPINE BESYLATE 5 MG PO TABS: 5.0000 mg | ORAL_TABLET | Freq: Every day | ORAL | 5 refills | Status: DC

## 2017-09-23 MED FILL — ?AMLODIPINE BESYLATE 5 MG T: 5 MG | 30 days supply | Qty: 30 | Fill #0

## 2017-09-23 NOTE — Progress Notes (Signed)
.  Subjective:    Patient ID: Victoria Carpenter, female    DOB: Feb 10, 1976, 42 y.o.   MRN: [id number]  HPI Victoria Carpenter, a 42 year old female with a history of hypertension and obesity presents to establish care. Victoria Carpenter was previously a patient at this clinic, but has been lost to follow up for greater than 1 year. Patient says that she has been without antihypertensive medications. She has not been exercising routinely or following a low sodium diet. Body mass index is 32.58 kg/m. 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 Patient does not check blood pressures at home. Patient denies chest pain, dyspnea, fatigue, irregular heart beat, lower extremity edema, syncope and tachypnea.  Cardiovascular risk factors: obesity (BMI >= 30 kg/m2), sedentary lifestyle and smoking/ tobacco exposure.   Past Medical History:  Diagnosis Date  . Hypertension   . Vaginal delivery    x 2  . Vaginitis due to Gardnerella vaginalis 2010   Social History   Socioeconomic History  . Marital status: Single    Spouse name: Not on file  . Number of children: Not on file  . Years of education: Not on file  . Highest education level: Not on file  Social Needs  . Financial resource strain: Not on file  . Food insecurity - worry: Not on file  . Food insecurity - inability: Not on file  . Transportation needs - medical: Not on file  . Transportation needs - non-medical: Not on file  Occupational History  . Not on file  Tobacco Use  . Smoking status: Current Every Day Smoker    Packs/day: 0.25    Types: Cigarettes  . Smokeless tobacco: Never Used  . Tobacco comment: afraid she will gain weight  Substance and Sexual Activity  . Alcohol use: No  . Drug use: No  . Sexual activity: Yes    Birth control/protection: Condom  Other Topics Concern  . Not on file  Social History Narrative   Home Health, private sitter   Review of Systems  Constitutional:  Negative.   HENT: Negative.   Eyes: Negative.   Respiratory: Negative.   Cardiovascular: Negative.   Gastrointestinal: Negative.   Endocrine: Negative for polydipsia, polyphagia and polyuria.  Genitourinary: Negative.  Negative for menstrual problem.  Musculoskeletal: Negative.  Negative for arthralgias, back pain, gait problem, joint swelling and myalgias.  Skin: Negative.   Allergic/Immunologic: Negative.   Neurological: Negative.   Hematological: Negative.   Psychiatric/Behavioral: Negative.        Objective:   Physical Exam  HENT:  Head: Normocephalic and atraumatic.  Right Ear: External ear normal.  Left Ear: External ear normal.  Nose: Nose normal.  Mouth/Throat: Oropharynx is clear and moist.  Eyes: Conjunctivae and EOM are normal. Pupils are equal, round, and reactive to light.  Neck: Normal range of motion. Neck supple.  Cardiovascular: Normal rate, regular rhythm, normal heart sounds and intact distal pulses.  Pulmonary/Chest: Effort normal and breath sounds normal.  Abdominal: Soft. Bowel sounds are normal.  Skin: Skin is warm.  Psychiatric: She has a normal mood and affect. Her behavior is normal. Judgment and thought content normal.   BP (!) 144/98 (BP Location: Right Arm, Patient Position: Sitting, Cuff Size: Normal) Comment: manually  Pulse 84   Temp 98.1 F (36.7 C) (Oral)   Resp 16   Ht 5\' 7"  (1.702 m)   Wt 208 lb (94.3 kg)   LMP  08/30/2017   SpO2 100%   BMI 32.58 kg/m     Assessment & Plan:  1. Essential hypertension Blood pressure is above goal . Will start Amlodipine 5 mg daily. We have discussed target BP range and blood pressure goal. I have advised patient to check BP regularly and to call us back or report to clinic if the numbers are consistently higher than 140/90. We discussed the importance of compliance with medical therapy and DASH diet recommended, consequences of uncontrolled hypertension discussed.    - POCT urinalysis dip (device) -  Basic Metabolic Panel - amLODipine (NORVASC) 5 MG tablet; Take 1 tablet (5 mg total) by mouth daily.  Dispense: 30 tablet; Refill: 5  2. Obesity (BMI 30-39.9) Recommend a lowfat, low carbohydrate diet divided over 5-6 small meals, increase water intake to 6-8 glasses, and 150 minutes per week of cardiovascular exercise.   - HgB A1c - TSH  3. Tobacco dependence Smoking cessation instruction/counseling given:  counseled patient on the dangers of tobacco use, advised patient to stop smoking, and reviewed strategies to maximize success  4. Screening for HIV (human immunodeficiency virus) - HIV antibody (with reflex)   RTC: 3 months for hypertension and pap smear   Nolon NationsLachina Moore Jayleena Stille  MSN, FNP-C Patient Care [facility] [address]  [place], [place] [id number] [phone]

## 2017-09-23 NOTE — Patient Instructions (Signed)
Your blood pressure is above goal, will restart amlodipine 5 mg daily.  -  Continue medication, monitor blood pressure at home. Continue DASH diet. Reminder to go to the ER if any CP, SOB, nausea, dizziness, severe HA, changes vision/speech, left arm numbness and tingling and jaw pain.  Body mass index is 32.58 kg/m.  Recommend a low calorie, low-fat, low carbohydrate diet divided over 5-6 small meals throughout the day.  Also recommend a low impact cardiovascular exercise routine 30 minutes/day 5 days/week per AHA guidelines. DASH Eating Plan DASH stands for "Dietary Approaches to Stop Hypertension." The DASH eating plan is a healthy eating plan that has been shown to reduce high blood pressure (hypertension). It may also reduce your risk for type 2 diabetes, heart disease, and stroke. The DASH eating plan may also help with weight loss. What are tips for following this plan? General guidelines  Avoid eating more than 2,300 mg (milligrams) of salt (sodium) a day. If you have hypertension, you may need to reduce your sodium intake to 1,500 mg a day.  Limit alcohol intake to no more than 1 drink a day for nonpregnant women and 2 drinks a day for men. One drink equals 12 oz of beer, 5 oz of wine, or 1 oz of hard liquor.  Work with your health care provider to maintain a healthy body weight or to lose weight. Ask what an ideal weight is for you.  Get at least 30 minutes of exercise that causes your heart to beat faster (aerobic exercise) most days of the week. Activities may include walking, swimming, or biking.  Work with your health care provider or diet and nutrition specialist (dietitian) to adjust your eating plan to your individual calorie needs. Reading food labels  Check food labels for the amount of sodium per serving. Choose foods with less than 5 percent of the Daily Value of sodium. Generally, foods with less than 300 mg of sodium per serving fit into this eating plan.  To find  whole grains, look for the word "whole" as the first word in the ingredient list. Shopping  Buy products labeled as "low-sodium" or "no salt added."  Buy fresh foods. Avoid canned foods and premade or frozen meals. Cooking  Avoid adding salt when cooking. Use salt-free seasonings or herbs instead of table salt or sea salt. Check with your health care provider or pharmacist before using salt substitutes.  Do not fry foods. Cook foods using healthy methods such as baking, boiling, grilling, and broiling instead.  Cook with heart-healthy oils, such as olive, canola, soybean, or sunflower oil. Meal planning   Eat a balanced diet that includes: ? 5 or more servings of fruits and vegetables each day. At each meal, try to fill half of your plate with fruits and vegetables. ? Up to 6-8 servings of whole grains each day. ? Less than 6 oz of lean meat, poultry, or fish each day. A 3-oz serving of meat is about the same size as a deck of cards. One egg equals 1 oz. ? 2 servings of low-fat dairy each day. ? A serving of nuts, seeds, or beans 5 times each week. ? Heart-healthy fats. Healthy fats called Omega-3 fatty acids are found in foods such as flaxseeds and coldwater fish, like sardines, salmon, and mackerel.  Limit how much you eat of the following: ? Canned or prepackaged foods. ? Food that is high in trans fat, such as fried foods. ? Food that is high in saturated  fat, such as fatty meat. ? Sweets, desserts, sugary drinks, and other foods with added sugar. ? Full-fat dairy products.  Do not salt foods before eating.  Try to eat at least 2 vegetarian meals each week.  Eat more home-cooked food and less restaurant, buffet, and fast food.  When eating at a restaurant, ask that your food be prepared with less salt or no salt, if possible. What foods are recommended? The items listed may not be a complete list. Talk with your dietitian about what dietary choices are best for  you. Grains Whole-grain or whole-wheat bread. Whole-grain or whole-wheat pasta. Brown rice. [place]. Bulgur. Whole-grain and low-sodium cereals. Pita bread. Low-fat, low-sodium crackers. Whole-wheat flour tortillas. Vegetables Fresh or frozen vegetables (raw, steamed, roasted, or grilled). Low-sodium or reduced-sodium tomato and vegetable juice. Low-sodium or reduced-sodium tomato sauce and tomato paste. Low-sodium or reduced-sodium canned vegetables. Fruits All fresh, dried, or frozen fruit. Canned fruit in natural juice (without added sugar). Meat and other protein foods Skinless chicken or [place]. Ground chicken or [place]. Pork with fat trimmed off. Fish and seafood. Egg whites. Dried beans, peas, or lentils. Unsalted nuts, nut butters, and seeds. Unsalted canned beans. Lean cuts of beef with fat trimmed off. Low-sodium, lean deli meat. Dairy Low-fat (1%) or fat-free (skim) milk. Fat-free, low-fat, or reduced-fat cheeses. Nonfat, low-sodium ricotta or cottage cheese. Low-fat or nonfat yogurt. Low-fat, low-sodium cheese. Fats and oils Soft margarine without trans fats. Vegetable oil. Low-fat, reduced-fat, or light mayonnaise and salad dressings (reduced-sodium). Canola, safflower, olive, soybean, and sunflower oils. Avocado. Seasoning and other foods Herbs. Spices. Seasoning mixes without salt. Unsalted popcorn and pretzels. Fat-free sweets. What foods are not recommended? The items listed may not be a complete list. Talk with your dietitian about what dietary choices are best for you. Grains Baked goods made with fat, such as croissants, muffins, or some breads. Dry pasta or rice meal packs. Vegetables Creamed or fried vegetables. Vegetables in a cheese sauce. Regular canned vegetables (not low-sodium or reduced-sodium). Regular canned tomato sauce and paste (not low-sodium or reduced-sodium). Regular tomato and vegetable juice (not low-sodium or reduced-sodium). Angie Fava.  Olives. Fruits Canned fruit in a light or heavy syrup. Fried fruit. Fruit in cream or butter sauce. Meat and other protein foods Fatty cuts of meat. Ribs. Fried meat. Berniece Salines. Sausage. Bologna and other processed lunch meats. Salami. Fatback. Hotdogs. Bratwurst. Salted nuts and seeds. Canned beans with added salt. Canned or smoked fish. Whole eggs or egg yolks. Chicken or [place] with skin. Dairy Whole or 2% milk, cream, and half-and-half. Whole or full-fat cream cheese. Whole-fat or sweetened yogurt. Full-fat cheese. Nondairy creamers. Whipped toppings. Processed cheese and cheese spreads. Fats and oils Butter. Stick margarine. Lard. Shortening. Ghee. Bacon fat. Tropical oils, such as coconut, palm kernel, or palm oil. Seasoning and other foods Salted popcorn and pretzels. Onion salt, garlic salt, seasoned salt, table salt, and sea salt. Worcestershire sauce. Tartar sauce. Barbecue sauce. Teriyaki sauce. Soy sauce, including reduced-sodium. Steak sauce. Canned and packaged gravies. Fish sauce. Oyster sauce. Cocktail sauce. Horseradish that you find on the shelf. Ketchup. Mustard. Meat flavorings and tenderizers. Bouillon cubes. Hot sauce and Tabasco sauce. Premade or packaged marinades. Premade or packaged taco seasonings. Relishes. Regular salad dressings. Where to find more information:  National Heart, Lung, and [place]: https://wilson-eaton.com/  American Heart Association: www.heart.org Summary  The DASH eating plan is a healthy eating plan that has been shown to reduce high blood pressure (hypertension). It may also reduce  your risk for type 2 diabetes, heart disease, and stroke.  With the DASH eating plan, you should limit salt (sodium) intake to 2,300 mg a day. If you have hypertension, you may need to reduce your sodium intake to 1,500 mg a day.  When on the DASH eating plan, aim to eat more fresh fruits and vegetables, whole grains, lean proteins, low-fat dairy, and heart-healthy  fats.  Work with your health care provider or diet and nutrition specialist (dietitian) to adjust your eating plan to your individual calorie needs. This information is not intended to replace advice given to you by your health care provider. Make sure you discuss any questions you have with your health care provider. Document Released: 08/06/2011 Document Revised: 08/10/2016 Document Reviewed: 08/10/2016 Elsevier Interactive Patient Education  2018 [employer].  Exercising to [employer] Exercising can help you to lose weight. In order to lose weight through exercise, you need to do vigorous-intensity exercise. You can tell that you are exercising with vigorous intensity if you are breathing very hard and fast and cannot hold a conversation while exercising. Moderate-intensity exercise helps to maintain your current weight. You can tell that you are exercising at a moderate level if you have a higher heart rate and faster breathing, but you are still able to hold a conversation. How often should I exercise? Choose an activity that you enjoy and set realistic goals. Your health care provider can help you to make an activity plan that works for you. Exercise regularly as directed by your health care provider. This may include:  Doing resistance training twice each week, such as: ? Push-ups. ? Sit-ups. ? Lifting weights. ? Using resistance bands.  Doing a given intensity of exercise for a given amount of time. Choose from these options: ? 150 minutes of moderate-intensity exercise every week. ? 75 minutes of vigorous-intensity exercise every week. ? A mix of moderate-intensity and vigorous-intensity exercise every week.  Children, pregnant women, people who are out of shape, people who are overweight, and older adults may need to consult a health care provider for individual recommendations. If you have any sort of medical condition, be sure to consult your health care provider before  starting a new exercise program. What are some activities that can help me to lose weight?  Walking at a rate of at least 4.5 miles an hour.  Jogging or running at a rate of 5 miles per hour.  Biking at a rate of at least 10 miles per hour.  Lap swimming.  Roller-skating or in-line skating.  Cross-country skiing.  Vigorous competitive sports, such as football, basketball, and soccer.  Jumping rope.  Aerobic dancing. How can I be more active in my day-to-day activities?  Use the stairs instead of the elevator.  Take a walk during your lunch break.  If you drive, park your car farther away from work or school.  If you take public transportation, get off one stop early and walk the rest of the way.  Make all of your phone calls while standing up and walking around.  Get up, stretch, and walk around every 30 minutes throughout the day. What guidelines should I follow while exercising?  Do not exercise so much that you hurt yourself, feel dizzy, or get very short of breath.  Consult your health care provider prior to starting a new exercise program.  Wear comfortable clothes and shoes with good support.  Drink plenty of water while you exercise to prevent dehydration or heat  stroke. Body water is lost during exercise and must be replaced.  Work out until you breathe faster and your heart beats faster. This information is not intended to replace advice given to you by your health care provider. Make sure you discuss any questions you have with your health care provider. Document Released: 09/19/2010 Document Revised: 01/23/2016 Document Reviewed: 01/18/2014 Elsevier Interactive Patient Education  [employer]2018 Elsevier Inc.

## 2017-09-24 ENCOUNTER — Telehealth: Payer: Self-pay

## 2017-09-24 LAB — HIV ANTIBODY (ROUTINE TESTING W REFLEX): HIV SCREEN 4TH GENERATION: NONREACTIVE

## 2017-09-24 LAB — BASIC METABOLIC PANEL
BUN/Creatinine Ratio: 10 (ref 9–23)
BUN: 8 mg/dL (ref 6–24)
CALCIUM: 9.2 mg/dL (ref 8.7–10.2)
CO2: 21 mmol/L (ref 20–29)
Chloride: 105 mmol/L (ref 96–106)
Creatinine, Ser: 0.82 mg/dL (ref 0.57–1.00)
GFR calc Af Amer: 101 mL/min/{1.73_m2} (ref >59)
GFR calc non Af Amer: 88 mL/min/{1.73_m2} (ref >59)
Glucose: 86 mg/dL (ref 65–99)
Potassium: 5.1 mmol/L (ref 3.5–5.2)
Sodium: 142 mmol/L (ref 134–144)

## 2017-09-24 LAB — TSH: TSH: 0.891 u[IU]/mL (ref 0.450–4.500)

## 2017-09-24 NOTE — Telephone Encounter (Signed)
Called, no answer. Left a message for patient to return call. Thanks!  

## 2017-09-24 NOTE — Telephone Encounter (Signed)
-----   Message from Massie MaroonLachina M Hollis, [place] sent at 09/24/2017  9:32 AM EST ----- Regarding: lab results Please inform patient that all laboratory values are within a normal range. Please start Amlodipine 5 mg daily. Will follow up in 3 months as scheduled  Thanks

## 2017-09-24 NOTE — Telephone Encounter (Signed)
Patient returned call. I advised of normal labs and to start amlodipine 5mg  daily and to keep a 3 month follow up. Patient verbalized understanding. Thanks!

## 2017-10-29 MED FILL — AMLODIPINE BESYLATE 5 MG TA: 5 | 30 days supply | Qty: 30 | Fill #1

## 2017-11-30 MED FILL — AMLODIPINE BESYLATE 5 MG TA: 5 | 30 days supply | Qty: 30 | Fill #2

## 2017-12-09 ENCOUNTER — Encounter: Payer: Self-pay | Admitting: Family Medicine

## 2017-12-09 ENCOUNTER — Ambulatory Visit (INDEPENDENT_AMBULATORY_CARE_PROVIDER_SITE_OTHER): Payer: BLUE CROSS/BLUE SHIELD | Admitting: Family Medicine

## 2017-12-09 VITALS — BP 142/90 | HR 84 | Temp 98.2°F | Resp 16 | Ht 67.0 in | Wt 209.0 lb

## 2017-12-09 DIAGNOSIS — F17200 Nicotine dependence, unspecified, uncomplicated: Secondary | ICD-10-CM | POA: Diagnosis not present

## 2017-12-09 DIAGNOSIS — Z1231 Encounter for screening mammogram for malignant neoplasm of breast: Secondary | ICD-10-CM | POA: Diagnosis not present

## 2017-12-09 DIAGNOSIS — Z1239 Encounter for other screening for malignant neoplasm of breast: Secondary | ICD-10-CM

## 2017-12-09 DIAGNOSIS — Z01419 Encounter for gynecological examination (general) (routine) without abnormal findings: Secondary | ICD-10-CM

## 2017-12-09 DIAGNOSIS — N926 Irregular menstruation, unspecified: Secondary | ICD-10-CM | POA: Diagnosis not present

## 2017-12-09 DIAGNOSIS — N951 Menopausal and female climacteric states: Secondary | ICD-10-CM | POA: Diagnosis not present

## 2017-12-09 DIAGNOSIS — R8290 Unspecified abnormal findings in urine: Secondary | ICD-10-CM

## 2017-12-09 LAB — POCT URINALYSIS DIPSTICK
Bilirubin, UA: NEGATIVE
Blood, UA: NEGATIVE
GLUCOSE UA: NEGATIVE
Nitrite, UA: NEGATIVE
Protein, UA: NEGATIVE
Spec Grav, UA: 1.015 (ref 1.010–1.025)
Urobilinogen, UA: 1 E.U./dL
pH, UA: 6 (ref 5.0–8.0)

## 2017-12-09 LAB — POCT URINE PREGNANCY: PREG TEST UR: NEGATIVE

## 2017-12-09 NOTE — Patient Instructions (Addendum)
Kegel Exercises Kegel exercises help strengthen the muscles that support the rectum, vagina, small intestine, bladder, and uterus. Doing Kegel exercises can help:  Improve bladder and bowel control.  Improve sexual response.  Reduce problems and discomfort during pregnancy.  Kegel exercises involve squeezing your pelvic floor muscles, which are the same muscles you squeeze when you try to stop the flow of urine. The exercises can be done while sitting, standing, or lying down, but it is best to vary your position. Phase 1 exercises 1. Squeeze your pelvic floor muscles tight. You should feel a tight lift in your rectal area. If you are a female, you should also feel a tightness in your vaginal area. Keep your stomach, buttocks, and legs relaxed. 2. Hold the muscles tight for up to 10 seconds. 3. Relax your muscles. Repeat this exercise 50 times a day or as many times as told by your health care provider. Continue to do this exercise for at least 4-6 weeks or for as long as told by your health care provider. This information is not intended to replace advice given to you by your health care provider. Make sure you discuss any questions you have with your health care provider. Document Released: 08/03/2012 Document Revised: 04/11/2016 Document Reviewed: 07/07/2015 Elsevier Interactive Patient Education  [employer]2018 Elsevier Inc. Menopause is the time that marks the end of your menstrual cycles. It's diagnosed after you've gone 12 months without a menstrual period.  Menopause can happen in your 2840s or 2850s, but the average age is 3951 in the [place]. Menopause is a natural biological process. But the physical symptoms, such as hot flashes, and emotional symptoms of menopause may disrupt your sleep, lower your energy or affect emotional health.  There are many effective treatments available, from lifestyle adjustments to hormone therapy.  Cool hot flashes. Dress in layers, have a cold glass of  water or go somewhere cooler. Try to pinpoint what triggers your hot flashes. For many women, triggers may include hot beverages, caffeine, spicy foods, alcohol, stress, hot weather and even a warm room.  Decrease vaginal discomfort. Use over-the-counter, water-based vaginal lubricants (Astroglide, K-Y jelly, others), silicone-based lubricants or moisturizers (Replens, others). Choose products that don't contain glycerin, which can cause burning or irritation in women who are sensitive to that chemical. Staying sexually active also helps by increasing blood flow to the vagina.  Get enough sleep. Avoid caffeine, which can make it hard to get to sleep, and avoid drinking too much alcohol, which can interrupt sleep. Exercise during the day, although not right before bedtime. If hot flashes disturb your sleep, you may need to find a way to manage them before you can get adequate rest.  Practice relaxation techniques. Techniques such as deep breathing, paced breathing, guided imagery, massage and progressive muscle relaxation may help with menopausal symptoms. You can find a number of books, CDs and online offerings on different relaxation exercises.  Strengthen your pelvic floor. Pelvic floor muscle exercises, called Kegel exercises, can improve some forms of urinary incontinence.  Eat a balanced diet. Include a variety of fruits, vegetables and whole grains. Limit saturated fats, oils and sugars. Ask your provider if you need calcium or vitamin D supplements to help meet daily requirements.  Don't smoke. Smoking increases your risk of heart disease, stroke, osteoporosis, cancer and a range of other health problems. It may also increase hot flashes and bring on earlier menopause.  Exercise regularly. Get regular physical activity or exercise on  most days to help protect against heart disease, diabetes, osteoporosis and other conditions associated with aging.

## 2017-12-09 NOTE — Progress Notes (Signed)
Subjective:     Victoria Carpenter is a 42 y.o. female here for a routine gynecological exam. Patient says that it has been greater than 3 years since previous pap smear. Last pap smear was negative. Patient has had 2 vaginal births. She does not perform monthly self breast exams. She has not had a mammogram in several years. She denies a family history of breast cancer. She has not been sexually active in 2 months. She is a chronic everyday tobacco user and has a 20 year smoking history.  Past Medical History:  Diagnosis Date  . Hypertension   . Vaginal delivery    x 2  . Vaginitis due to Gardnerella vaginalis 2010   Immunization History  Administered Date(s) Administered  . Td 01/21/2011   Social History   Socioeconomic History  . Marital status: Single    Spouse name: Not on file  . Number of children: Not on file  . Years of education: Not on file  . Highest education level: Not on file  Occupational History  . Not on file  Social Needs  . Financial resource strain: Not on file  . Food insecurity:    Worry: Not on file    Inability: Not on file  . Transportation needs:    Medical: Not on file    Non-medical: Not on file  Tobacco Use  . Smoking status: Current Every Day Smoker    Packs/day: 0.25    Types: Cigarettes  . Smokeless tobacco: Never Used  . Tobacco comment: afraid she will gain weight  Substance and Sexual Activity  . Alcohol use: No  . Drug use: No  . Sexual activity: Yes    Birth control/protection: Condom  Lifestyle  . Physical activity:    Days per week: Not on file    Minutes per session: Not on file  . Stress: Not on file  Relationships  . Social connections:    Talks on phone: Not on file    Gets together: Not on file    Attends religious service: Not on file    Active member of club or organization: Not on file    Attends meetings of clubs or organizations: Not on file    Relationship status: Not on file  . Intimate partner violence:   Fear of current or ex partner: Not on file    Emotionally abused: Not on file    Physically abused: Not on file    Forced sexual activity: Not on file  Other Topics Concern  . Not on file  Social History Narrative   Home Health, private sitter      Gynecologic History Patient's last menstrual period was 11/24/2017. Obstetric History OB History  No data available      BP (!) 140/92 (BP Location: Left Arm, Patient Position: Sitting, Cuff Size: Normal)   Pulse 84   Temp 98.2 F (36.8 C) (Oral)   Resp 16   Ht 5\' 7"  (1.702 m)   Wt 209 lb (94.8 kg)   LMP 11/24/2017   SpO2 100%   BMI 32.73 kg/m  Review of Systems  Constitutional: Negative.   HENT: Negative.   Respiratory: Negative.   Cardiovascular: Negative.   Gastrointestinal: Negative.   Genitourinary: Negative.   Musculoskeletal: Negative.   Skin: Negative.   Neurological: Negative.   Psychiatric/Behavioral: Negative.     Objective:  Physical Exam  Constitutional: She is oriented to person, place, and time. She appears well-developed and well-nourished.  HENT:  Head:  Normocephalic and atraumatic.  Eyes: Pupils are equal, round, and reactive to light.  Neck: Normal range of motion.  Cardiovascular: Normal rate, regular rhythm, normal heart sounds and intact distal pulses.  Pulmonary/Chest: Effort normal and breath sounds normal.  Abdominal: Soft. Bowel sounds are normal.  Genitourinary: Uterus normal. Rectal exam shows guaiac negative stool. Pelvic exam was performed with patient prone. There is no rash, tenderness or lesion on the right labia. There is no rash, tenderness or lesion on the left labia. Cervix exhibits discharge. Cervix exhibits no motion tenderness and no friability. Right adnexum displays no mass, no tenderness and no fullness. Left adnexum displays no mass, no tenderness and no fullness. No tenderness in the vagina. Vaginal discharge found.  Neurological: She is alert and oriented to person, place,  and time.  Skin: Skin is warm and dry.  Psychiatric: She has a normal mood and affect. Her behavior is normal. Judgment and thought content normal.    Assessment:    Healthy female exam.    Plan:   1. Breast cancer screening Recommend monthly self breast exams - MM Digital Diagnostic Bilat; Future  2. Abnormal menses - POCT urine pregnancy  3. Perimenopausal Discussed menopausal symptoms at length. Patient endorses periodic hot flashes. Given written information on menopause  4. Pap test, as part of routine gynecological examination - IGP,CtNgTv,Apt HPV,rfx16/18,45 - Urinalysis Dipstick  5. Abnormal urinalysis - Urine Culture  6. Tobacco dependence Smoking cessation instruction/counseling given:  counseled patient on the dangers of tobacco use, advised patient to stop smoking, and reviewed strategies to maximize success   RTC; Will follow up by phone with laboratory results.   Nolon NationsLachina Moore Ofilia Rayon  MSN, FNP-C Patient Care [facility] [address]  [place], [place] [id number] [phone]

## 2017-12-11 LAB — URINE CULTURE

## 2017-12-12 ENCOUNTER — Other Ambulatory Visit: Payer: Self-pay | Admitting: Family Medicine

## 2017-12-12 DIAGNOSIS — A599 Trichomoniasis, unspecified: Secondary | ICD-10-CM

## 2017-12-12 LAB — IGP,CTNGTV,APT HPV,RFX16/18,45
CHLAMYDIA, NUC. ACID AMP: NEGATIVE
GONOCOCCUS, NUC. ACID AMP: NEGATIVE
HPV Aptima: NEGATIVE
PAP Smear Comment: 0
TRICH VAG BY NAA: POSITIVE — AB

## 2017-12-12 MED ORDER — METRONIDAZOLE 500 MG PO TABS: 500.0000 mg | ORAL_TABLET | Freq: Two times a day (BID) | ORAL | 0 refills | Status: DC

## 2017-12-12 NOTE — Progress Notes (Signed)
Meds ordered this encounter  Medications  . metroNIDAZOLE (FLAGYL) 500 MG tablet    Sig: Take 1 tablet (500 mg total) by mouth 2 (two) times daily.    Dispense:  14 tablet    Refill:  0     [name]  MSN, FNP-C [facility] Care [facility] [address]  [place], [place] [address] [phone]  

## 2017-12-13 ENCOUNTER — Telehealth: Payer: Self-pay

## 2017-12-13 MED FILL — metroNIDAZOLE 500 MG TABS: 500 | 7 days supply | Qty: 14 | Fill #0

## 2017-12-13 NOTE — Telephone Encounter (Signed)
Patient called back and I advised that sample was positive for trichomonas vaginitis which is a sexually transmitted. Advised that she would need to start metronidazole 500 mg twice daily with 7 days. Advised that pap smear was negative and does not need to be repeated for 10 years. Thanks!

## 2017-12-13 NOTE — Telephone Encounter (Signed)
-----   Message from Massie MaroonLachina M Hollis, [place] sent at 12/12/2017  3:45 PM EDT ----- Regarding: lab results Please inform patient that vaginal sample was positive for trichomonas vaginitis, which is parasite that is transmitted sexually.  Will start Metronidazole 500 mg BID for 7 days. Refrain from alcohol while taking metronidazole. Pap smear is negative, will repeat in 10 years per current guidelines.   Nolon NationsLachina Moore Hollis  MSN, FNP-C Patient Care [facility] [address]  [place], [place] [address] [phone]

## 2017-12-13 NOTE — Telephone Encounter (Signed)
Called, no answer. Left a message for patient to call back. Thanks!  

## 2017-12-16 ENCOUNTER — Other Ambulatory Visit: Payer: Self-pay | Admitting: Family Medicine

## 2017-12-16 DIAGNOSIS — Z1231 Encounter for screening mammogram for malignant neoplasm of breast: Secondary | ICD-10-CM

## 2017-12-22 ENCOUNTER — Ambulatory Visit: Payer: Self-pay | Admitting: Family Medicine

## 2017-12-31 ENCOUNTER — Ambulatory Visit
Admission: RE | Admit: 2017-12-31 | Discharge: 2017-12-31 | Disposition: A | Discharge status: Home / Self Care | Payer: BLUE CROSS/BLUE SHIELD | Source: Ambulatory Visit | Attending: Family Medicine | Admitting: Family Medicine

## 2017-12-31 DIAGNOSIS — Z1231 Encounter for screening mammogram for malignant neoplasm of breast: Secondary | ICD-10-CM

## 2018-01-03 ENCOUNTER — Other Ambulatory Visit: Payer: Self-pay | Admitting: Family Medicine

## 2018-01-03 DIAGNOSIS — R928 Other abnormal and inconclusive findings on diagnostic imaging of breast: Secondary | ICD-10-CM

## 2018-01-10 ENCOUNTER — Ambulatory Visit
Admission: RE | Admit: 2018-01-10 | Discharge: 2018-01-10 | Disposition: A | Discharge status: Home / Self Care | Payer: BLUE CROSS/BLUE SHIELD | Source: Ambulatory Visit | Attending: Family Medicine | Admitting: Family Medicine

## 2018-01-10 ENCOUNTER — Other Ambulatory Visit: Payer: Self-pay | Admitting: Family Medicine

## 2018-01-10 DIAGNOSIS — R928 Other abnormal and inconclusive findings on diagnostic imaging of breast: Secondary | ICD-10-CM

## 2018-01-10 DIAGNOSIS — N6489 Other specified disorders of breast: Secondary | ICD-10-CM

## 2018-01-11 ENCOUNTER — Ambulatory Visit: Payer: BLUE CROSS/BLUE SHIELD

## 2018-03-10 ENCOUNTER — Ambulatory Visit: Payer: BLUE CROSS/BLUE SHIELD | Admitting: Family Medicine

## 2019-03-01 ENCOUNTER — Telehealth: Payer: Self-pay

## 2019-03-01 NOTE — Telephone Encounter (Signed)
Called and spoke with patient for COVID 19 Screening. Patient had no risk factors and is cleared to come into office for appointment. Thanks! 

## 2019-03-02 ENCOUNTER — Other Ambulatory Visit: Payer: Self-pay

## 2019-03-02 ENCOUNTER — Ambulatory Visit (INDEPENDENT_AMBULATORY_CARE_PROVIDER_SITE_OTHER): Payer: Self-pay | Admitting: Family Medicine

## 2019-03-02 ENCOUNTER — Encounter: Payer: Self-pay | Admitting: Family Medicine

## 2019-03-02 VITALS — BP 152/98 | HR 80 | Temp 98.6°F | Resp 16 | Ht 64.25 in | Wt 208.0 lb

## 2019-03-02 DIAGNOSIS — N939 Abnormal uterine and vaginal bleeding, unspecified: Secondary | ICD-10-CM

## 2019-03-02 DIAGNOSIS — I10 Essential (primary) hypertension: Secondary | ICD-10-CM

## 2019-03-02 LAB — POCT URINALYSIS DIPSTICK
Bilirubin, UA: NEGATIVE
Blood, UA: NEGATIVE
Glucose, UA: NEGATIVE
Nitrite, UA: NEGATIVE
Protein, UA: NEGATIVE
Spec Grav, UA: 1.02 (ref 1.010–1.025)
Urobilinogen, UA: 1 E.U./dL
pH, UA: 5.5 (ref 5.0–8.0)

## 2019-03-02 MED ORDER — AMLODIPINE BESYLATE 5 MG PO TABS: 5.0000 mg | ORAL_TABLET | Freq: Every day | ORAL | 3 refills | Status: DC

## 2019-03-02 MED FILL — AMLODIPINE BESYLATE 5 MG TA: 5 | 90 days supply | Qty: 90 | Fill #0

## 2019-03-02 NOTE — Progress Notes (Signed)
[facility]  New Patient Encounter Provider: Lanae Boast, [place]  WIO:[id number]  DOB - [birth year]  SUBJECTIVE:   Victoria Carpenter, is a 43 y.o. female who presents to re-establish care with this clinic. Patient previously seen at this clinic and has not had medication in over a year. She denies eating a low sodium diet or exercising. She also reports having abnormal vaginal bleeding that lasted longer than her normal period. She reports being sexually active with one female partner with out barrier methods. She states that the bleeding has now resolved. Denies possibility of STIs.   No Known Allergies Past Medical History:  Diagnosis Date  . Hypertension   . Vaginal delivery    x 2  . Vaginitis due to Gardnerella vaginalis 2010   Current Outpatient Medications on File Prior to Visit  Medication Sig Dispense Refill  . ibuprofen (ADVIL,MOTRIN) 200 MG tablet Take 200 mg by mouth every 6 (six) hours as needed.     No current facility-administered medications on file prior to visit.    Family History  Problem Relation Age of Onset  . Hypertension Mother   . Hypertension Father    Social History   Socioeconomic History  . Marital status: Single    Spouse name: Not on file  . Number of children: Not on file  . Years of education: Not on file  . Highest education level: Not on file  Occupational History  . Not on file  Social Needs  . Financial resource strain: Not on file  . Food insecurity    Worry: Not on file    Inability: Not on file  . Transportation needs    Medical: Not on file    Non-medical: Not on file  Tobacco Use  . Smoking status: Current Every Day Smoker    Packs/day: 0.25    Types: Cigarettes  . Smokeless tobacco: Never Used  . Tobacco comment: afraid she will gain weight  Substance and Sexual Activity  . Alcohol use: No  . Drug use: No  . Sexual activity: Yes    Birth  control/protection: Condom  Lifestyle  . Physical activity    Days per week: Not on file    Minutes per session: Not on file  . Stress: Not on file  Relationships  . Social Herbalist on phone: Not on file    Gets together: Not on file    Attends religious service: Not on file    Active member of club or organization: Not on file    Attends meetings of clubs or organizations: Not on file    Relationship status: Not on file  . Intimate partner violence    Fear of current or ex partner: Not on file    Emotionally abused: Not on file    Physically abused: Not on file    Forced sexual activity: Not on file  Other Topics Concern  . Not on file  Social History Narrative   Home Health, private sitter    Review of Systems  Constitutional: Negative.   HENT: Negative.   Eyes: Negative.   Respiratory: Negative.   Cardiovascular: Negative.   Gastrointestinal: Negative.   Genitourinary: Negative.   Musculoskeletal: Negative.   Skin: Negative.   Neurological: Negative.   Psychiatric/Behavioral: Negative.      OBJECTIVE:    BP (!) 152/98 (BP Location: Left Arm, Patient Position: Sitting, Cuff Size: Normal) Comment: manually  Pulse 80   Temp 98.6 F (37 C) (Oral)   Resp 16   Ht 5' 4.25" (1.632 m)   Wt 208 lb (94.3 kg)   LMP 02/16/2019   SpO2 100%   BMI 35.43 kg/m   Physical Exam  Constitutional: She is oriented to person, place, and time and well-developed, well-nourished, and in no distress. No distress.  HENT:  Head: Normocephalic and atraumatic.  Eyes: Pupils are equal, round, and reactive to light. Conjunctivae and EOM are normal.  Neck: Normal range of motion. Neck supple.  Cardiovascular: Normal rate, regular rhythm and intact distal pulses. Exam reveals no gallop and no friction rub.  No murmur heard. Pulmonary/Chest: Effort normal and breath sounds normal. No respiratory distress. She has no wheezes.  Abdominal: Soft. Bowel sounds are normal. There is  no abdominal tenderness.  Musculoskeletal: Normal range of motion.        General: No tenderness or edema.  Lymphadenopathy:    She has no cervical adenopathy.  Neurological: She is alert and oriented to person, place, and time. Gait normal.  Skin: Skin is warm and dry.  Psychiatric: Mood, memory, affect and judgment normal.  Nursing note and vitals reviewed.    ASSESSMENT/PLAN:  1. Essential hypertension Restart medications. The patient is asked to make an attempt to improve diet and exercise patterns to aid in medical management of this problem.  - Urinalysis Dipstick - amLODipine (NORVASC) 5 MG tablet; Take 1 tablet (5 mg total) by mouth daily.  Dispense: 90 tablet; Refill: 3 - Comprehensive metabolic panel - Lipid Panel - Vitamin D, 25-hydroxy  2. Abnormal vaginal bleeding Resolved. Discussed possible causes that include pre-menopause, STIs, stress, and hormones.  - Chlamydia/GC NAA, Confirmation - TSH - CBC with Differential    Return in about 3 months (around 06/02/2019), or 2 weeks BP check, for htn.  The patient was given clear instructions to go to ER or return to medical center if symptoms don't improve, worsen or new problems develop. The patient verbalized understanding. The patient was told to call to get lab results if they haven't heard anything in the next week.     This note has been created with Education officer, environmentalDragon speech recognition software and smart phrase technology. Any transcriptional errors are unintentional.   [name], FNP-BC [facility] [address]  [place], [place] [id number] [phone]

## 2019-03-03 LAB — CBC WITH DIFFERENTIAL/PLATELET
Basophils Absolute: 0.1 10*3/uL (ref 0.0–0.2)
Basos: 1 %
EOS (ABSOLUTE): 0.2 10*3/uL (ref 0.0–0.4)
Eos: 3 %
Hematocrit: 38.5 % (ref 34.0–46.6)
Hemoglobin: 12.8 g/dL (ref 11.1–15.9)
Immature Grans (Abs): 0 10*3/uL (ref 0.0–0.1)
Immature Granulocytes: 0 %
Lymphocytes Absolute: 3 10*3/uL (ref 0.7–3.1)
Lymphs: 44 %
MCH: 29 pg (ref 26.6–33.0)
MCHC: 33.2 g/dL (ref 31.5–35.7)
MCV: 87 fL (ref 79–97)
Monocytes Absolute: 0.3 10*3/uL (ref 0.1–0.9)
Monocytes: 5 %
Neutrophils Absolute: 3.1 10*3/uL (ref 1.4–7.0)
Neutrophils: 47 %
Platelets: 291 10*3/uL (ref 150–450)
RBC: 4.42 x10E6/uL (ref 3.77–5.28)
RDW: 13.7 % (ref 11.7–15.4)
WBC: 6.7 10*3/uL (ref 3.4–10.8)

## 2019-03-03 LAB — COMPREHENSIVE METABOLIC PANEL
ALT: 7 IU/L (ref 0–32)
AST: 9 IU/L (ref 0–40)
Albumin/Globulin Ratio: 1.4 (ref 1.2–2.2)
Albumin: 4 g/dL (ref 3.8–4.8)
Alkaline Phosphatase: 72 IU/L (ref 39–117)
BUN/Creatinine Ratio: 8 — ABNORMAL LOW (ref 9–23)
BUN: 7 mg/dL (ref 6–24)
Bilirubin Total: 0.3 mg/dL (ref 0.0–1.2)
CO2: 22 mmol/L (ref 20–29)
Calcium: 9.3 mg/dL (ref 8.7–10.2)
Chloride: 104 mmol/L (ref 96–106)
Creatinine, Ser: 0.91 mg/dL (ref 0.57–1.00)
GFR calc Af Amer: 88 mL/min/{1.73_m2} (ref >59)
GFR calc non Af Amer: 76 mL/min/{1.73_m2} (ref >59)
Globulin, Total: 2.8 g/dL (ref 1.5–4.5)
Glucose: 81 mg/dL (ref 65–99)
Potassium: 4.6 mmol/L (ref 3.5–5.2)
Sodium: 139 mmol/L (ref 134–144)
Total Protein: 6.8 g/dL (ref 6.0–8.5)

## 2019-03-03 LAB — LIPID PANEL
Chol/HDL Ratio: 5.1 ratio — ABNORMAL HIGH (ref 0.0–4.4)
Cholesterol, Total: 189 mg/dL (ref 100–199)
HDL: 37 mg/dL — ABNORMAL LOW (ref >39)
LDL Calculated: 128 mg/dL — ABNORMAL HIGH (ref 0–99)
Triglycerides: 122 mg/dL (ref 0–149)
VLDL Cholesterol Cal: 24 mg/dL (ref 5–40)

## 2019-03-03 LAB — VITAMIN D 25 HYDROXY (VIT D DEFICIENCY, FRACTURES): Vit D, 25-Hydroxy: 6.4 ng/mL — ABNORMAL LOW (ref 30.0–100.0)

## 2019-03-03 LAB — TSH: TSH: 1.76 u[IU]/mL (ref 0.450–4.500)

## 2019-03-05 LAB — CHLAMYDIA/GC NAA, CONFIRMATION
Chlamydia trachomatis, NAA: NEGATIVE
Neisseria gonorrhoeae, NAA: NEGATIVE

## 2019-03-06 ENCOUNTER — Other Ambulatory Visit: Payer: Self-pay | Admitting: Family Medicine

## 2019-03-06 MED ORDER — VITAMIN D (ERGOCALCIFEROL) 1.25 MG (50000 UNIT) PO CAPS: 50000.0000 [IU] | ORAL_CAPSULE | ORAL | 0 refills | Status: AC

## 2019-03-06 NOTE — Progress Notes (Signed)
Low Vitamin D. All other labs are stable. I will send Vit D to the pharmacy. After completion of the prescription, patient will need otc vitamin D of 1,000units daily. Your cholesterol is elevated. I recommend diet and exercise to help with this. You can also take otc fish oil to help this to come down.   Please remember to keep your follow up appointment. If you have problems, questions or concerns, please make an appointment to discuss. Thanks!

## 2019-03-07 ENCOUNTER — Telehealth: Payer: Self-pay

## 2019-03-07 MED FILL — VIT D2 1.25 MG (50,000 UNIT: 1.25 MG | 56 days supply | Qty: 8 | Fill #0

## 2019-03-07 NOTE — Telephone Encounter (Signed)
-----   Message from Lanae Boast, [place] sent at 03/06/2019 10:56 PM EDT ----- Low Vitamin D. All other labs are stable. I will send Vit D to the pharmacy. After completion of the prescription, patient will need otc vitamin D of 1,000units daily. Your cholesterol is elevated. I recommend diet and exercise to help with this. You can also take otc fish oil to help this to come down.   Please remember to keep your follow up appointment. If you have problems, questions or concerns, please make an appointment to discuss. Thanks!

## 2019-03-07 NOTE — Telephone Encounter (Signed)
Called and spoke with patient, advised that labs were stable besides vitamin D being low and cholesterol being elevated. Recommended a diet and exercise to help with cholesterol. Advised that she can take otc fish oil to help with cholesterol as well. Advised that we have sent in rx for vitamin D and to take as directed until completed and then take otc vitamin d 1000 units daily. Patient verbalized understanding. Thanks!

## 2019-03-16 ENCOUNTER — Ambulatory Visit: Payer: Self-pay

## 2019-03-16 ENCOUNTER — Other Ambulatory Visit: Payer: Self-pay

## 2019-03-16 VITALS — BP 138/88

## 2019-03-16 DIAGNOSIS — I10 Essential (primary) hypertension: Secondary | ICD-10-CM

## 2019-05-09 ENCOUNTER — Other Ambulatory Visit: Payer: Self-pay | Admitting: *Deleted

## 2019-05-09 DIAGNOSIS — Z20822 Contact with and (suspected) exposure to covid-19: Secondary | ICD-10-CM

## 2019-05-10 ENCOUNTER — Encounter (HOSPITAL_COMMUNITY): Payer: Self-pay

## 2019-05-10 ENCOUNTER — Encounter (HOSPITAL_COMMUNITY): Payer: Self-pay | Admitting: *Deleted

## 2019-05-11 LAB — NOVEL CORONAVIRUS, NAA: SARS-CoV-2, NAA: NOT DETECTED

## 2019-05-12 ENCOUNTER — Ambulatory Visit: Payer: Self-pay

## 2019-05-12 NOTE — Telephone Encounter (Signed)
Incoming call from Patient who states that she has conflicting [id number] test results. Patient request information as to what to do regarding conflicting results.  Provided advice Patient voiced understanding.   and CDC recommendations.  Patient reports that she repeat Covid-19 testing.

## 2019-05-15 ENCOUNTER — Other Ambulatory Visit: Payer: Self-pay

## 2019-05-15 DIAGNOSIS — Z20822 Contact with and (suspected) exposure to covid-19: Secondary | ICD-10-CM

## 2019-05-16 LAB — NOVEL CORONAVIRUS, NAA: SARS-CoV-2, NAA: NOT DETECTED

## 2019-06-02 ENCOUNTER — Other Ambulatory Visit: Payer: Self-pay

## 2019-06-02 ENCOUNTER — Encounter: Payer: Self-pay | Admitting: Family Medicine

## 2019-06-02 ENCOUNTER — Ambulatory Visit (INDEPENDENT_AMBULATORY_CARE_PROVIDER_SITE_OTHER): Payer: Self-pay | Admitting: Family Medicine

## 2019-06-02 VITALS — BP 147/89 | HR 79 | Temp 98.0°F | Ht 64.25 in | Wt 210.4 lb

## 2019-06-02 DIAGNOSIS — N390 Urinary tract infection, site not specified: Secondary | ICD-10-CM

## 2019-06-02 DIAGNOSIS — I10 Essential (primary) hypertension: Secondary | ICD-10-CM

## 2019-06-02 DIAGNOSIS — R8290 Unspecified abnormal findings in urine: Secondary | ICD-10-CM

## 2019-06-02 DIAGNOSIS — Z09 Encounter for follow-up examination after completed treatment for conditions other than malignant neoplasm: Secondary | ICD-10-CM

## 2019-06-02 LAB — POCT URINALYSIS DIPSTICK
Bilirubin, UA: NEGATIVE
Glucose, UA: NEGATIVE
Ketones, UA: NEGATIVE
Nitrite, UA: NEGATIVE
Protein, UA: POSITIVE — AB
Spec Grav, UA: 1.025 (ref 1.010–1.025)
Urobilinogen, UA: 1 E.U./dL
pH, UA: 5.5 (ref 5.0–8.0)

## 2019-06-02 MED ORDER — HYDROCHLOROTHIAZIDE 25 MG PO TABS: 25.0000 mg | ORAL_TABLET | Freq: Every day | ORAL | 2 refills | Status: DC

## 2019-06-02 MED ORDER — SULFAMETHOXAZOLE-TRIMETHOPRIM 800-160 MG PO TABS: 1.0000 | ORAL_TABLET | Freq: Two times a day (BID) | ORAL | 0 refills | Status: AC

## 2019-06-02 MED FILL — AMLODIPINE BESYLATE 5 MG TA: 5 | 90 days supply | Qty: 90 | Fill #1

## 2019-06-02 MED FILL — HYDROCHLOROTHIAZIDE 25 MG T: 25 | 30 days supply | Qty: 30 | Fill #0

## 2019-06-02 MED FILL — SULFAMETHOXAZOLE-TMP DS TAB: 800-160 | 7 days supply | Qty: 14 | Fill #0

## 2019-06-02 NOTE — Progress Notes (Signed)
[facility]   Established Patient Office Visit  Subjective:  Patient ID: Victoria Carpenter, female    DOB: 1976/05/10  Age: 43 y.o. MRN: [id number]  CC:  Chief Complaint  Patient presents with  . Follow-up    HTN, refill on Amlodpine,not other concerns, Vit D follow up    HPI Victoria Carpenter is a 43 year old femalewho presents for Follow Up today.   Past Medical History:  Diagnosis Date  . Hypertension   . Vaginal delivery    x 2  . Vaginitis due to Gardnerella vaginalis 2010   Current Status: This will be Victoria Carpenter's initial office visit with me. She was previously seeing Mike Gip, NP for her PCP needs. Since her last office visit, she is doing well with no complaints. She denies visual changes, chest pain, cough, shortness of breath, heart palpitations, and falls. She has occasional headaches and dizziness with position changes. Denies severe headaches, confusion, seizures, double vision, and blurred vision, nausea and vomiting. She denies fevers, chills, fatigue, recent infections, weight loss, and night sweats. No reports of GI problems such as diarrhea, and constipation. She has no reports of blood in stools, dysuria and hematuria. No depression or anxiety reported today. She denies pain today.   History reviewed. No pertinent surgical history.  Family History  Problem Relation Age of Onset  . Hypertension Mother   . Hypertension Father     Social History   Socioeconomic History  . Marital status: Single    Spouse name: Not on file  . Number of children: Not on file  . Years of education: Not on file  . Highest education level: Not on file  Occupational History  . Not on file  Social Needs  . Financial resource strain: Not on file  . Food insecurity    Worry: Not on file    Inability: Not on file  . Transportation needs    Medical: Not on file    Non-medical: Not on file  Tobacco Use  .  Smoking status: Current Every Day Smoker    Packs/day: 0.25    Types: Cigarettes  . Smokeless tobacco: Never Used  . Tobacco comment: afraid she will gain weight  Substance and Sexual Activity  . Alcohol use: No  . Drug use: No  . Sexual activity: Yes    Birth control/protection: Condom  Lifestyle  . Physical activity    Days per week: Not on file    Minutes per session: Not on file  . Stress: Not on file  Relationships  . Social Musician on phone: Not on file    Gets together: Not on file    Attends religious service: Not on file    Active member of club or organization: Not on file    Attends meetings of clubs or organizations: Not on file    Relationship status: Not on file  . Intimate partner violence    Fear of current or ex partner: Not on file    Emotionally abused: Not on file    Physically abused: Not on file    Forced sexual activity: Not on file  Other Topics Concern  . Not on file  Social History Narrative   Home Health, private sitter    Outpatient Medications Prior to Visit  Medication Sig Dispense Refill  . amLODipine (NORVASC) 5 MG tablet Take 1 tablet (5 mg total) by mouth daily. 90  tablet 3  . ibuprofen (ADVIL,MOTRIN) 200 MG tablet Take 200 mg by mouth every 6 (six) hours as needed.     No facility-administered medications prior to visit.     No Known Allergies  ROS Review of Systems  Constitutional: Negative.   HENT: Negative.   Eyes: Negative.   Respiratory: Negative.   Cardiovascular: Negative.   Gastrointestinal: Negative.   Endocrine: Negative.   Genitourinary: Negative.   Musculoskeletal: Negative.   Skin: Negative.   Allergic/Immunologic: Negative.   Neurological: Positive for dizziness (occasional ) and headaches (occasional ).  Hematological: Negative.   Psychiatric/Behavioral: Negative.       Objective:    Physical Exam  Constitutional: She is oriented to person, place, and time. She appears well-developed and  well-nourished.  HENT:  Head: Normocephalic.  Eyes: Conjunctivae are normal.  Neck: Normal range of motion. Neck supple.  Cardiovascular: Normal rate, regular rhythm, normal heart sounds and intact distal pulses.  Pulmonary/Chest: Effort normal and breath sounds normal.  Abdominal: Soft. Bowel sounds are normal.  Musculoskeletal: Normal range of motion.  Neurological: She is alert and oriented to person, place, and time. She has normal reflexes.  Skin: Skin is warm and dry.  Psychiatric: She has a normal mood and affect. Her behavior is normal. Judgment and thought content normal.  Nursing note and vitals reviewed.   BP (!) 147/89 (BP Location: Left Arm, Patient Position: Sitting, Cuff Size: Large)   Pulse 79   Temp 98 F (36.7 C)   Ht 5' 4.25" (1.632 m)   Wt 210 lb 6.4 oz (95.4 kg)   LMP 05/19/2019   SpO2 97%   BMI 35.83 kg/m  Wt Readings from Last 3 Encounters:  06/02/19 210 lb 6.4 oz (95.4 kg)  03/02/19 208 lb (94.3 kg)  12/09/17 209 lb (94.8 kg)     There are no preventive care reminders to display for this patient.  There are no preventive care reminders to display for this patient.  Lab Results  Component Value Date   TSH 1.760 03/02/2019   Lab Results  Component Value Date   WBC 6.7 03/02/2019   HGB 12.8 03/02/2019   HCT 38.5 03/02/2019   MCV 87 03/02/2019   PLT 291 03/02/2019   Lab Results  Component Value Date   NA 139 03/02/2019   K 4.6 03/02/2019   CO2 22 03/02/2019   GLUCOSE 81 03/02/2019   BUN 7 03/02/2019   CREATININE 0.91 03/02/2019   BILITOT 0.3 03/02/2019   ALKPHOS 72 03/02/2019   AST 9 03/02/2019   ALT 7 03/02/2019   PROT 6.8 03/02/2019   ALBUMIN 4.0 03/02/2019   CALCIUM 9.3 03/02/2019   Lab Results  Component Value Date   CHOL 189 03/02/2019   Lab Results  Component Value Date   HDL 37 (L) 03/02/2019   Lab Results  Component Value Date   LDLCALC 128 (H) 03/02/2019   Lab Results  Component Value Date   TRIG 122 03/02/2019    Lab Results  Component Value Date   CHOLHDL 5.1 (H) 03/02/2019   Lab Results  Component Value Date   HGBA1C 5.4 09/23/2017      Assessment & Plan:   Problem List Items Addressed This Visit      Cardiovascular and Mediastinum   Essential hypertension - Primary   Relevant Orders   POCT urinalysis dipstick (Completed)      No orders of the defined types were placed in this encounter.   Follow-up: No  follow-ups on file.    Azzie Glatter, FNP

## 2019-06-02 NOTE — Patient Instructions (Signed)
Hypertension, Adult Hypertension is another name for high blood pressure. High blood pressure forces your heart to work harder to pump blood. This can cause problems over time. There are two numbers in a blood pressure reading. There is a top number (systolic) over a bottom number (diastolic). It is best to have a blood pressure that is below 120/80. Healthy choices can help lower your blood pressure, or you may need medicine to help lower it. What are the causes? The cause of this condition is not known. Some conditions may be related to high blood pressure. What increases the risk?  Smoking.  Having type 2 diabetes mellitus, high cholesterol, or both.  Not getting enough exercise or physical activity.  Being overweight.  Having too much fat, sugar, calories, or salt (sodium) in your diet.  Drinking too much alcohol.  Having long-term (chronic) kidney disease.  Having a family history of high blood pressure.  Age. Risk increases with age.  Race. You may be at higher risk if you are African American.  Gender. Men are at higher risk than women before age 45. After age 65, women are at higher risk than men.  Having obstructive sleep apnea.  Stress. What are the signs or symptoms?  High blood pressure may not cause symptoms. Very high blood pressure (hypertensive crisis) may cause: ? Headache. ? Feelings of worry or nervousness (anxiety). ? Shortness of breath. ? Nosebleed. ? A feeling of being sick to your stomach (nausea). ? Throwing up (vomiting). ? Changes in how you see. ? Very bad chest pain. ? Seizures. How is this treated?  This condition is treated by making healthy lifestyle changes, such as: ? Eating healthy foods. ? Exercising more. ? Drinking less alcohol.  Your health care provider may prescribe medicine if lifestyle changes are not enough to get your blood pressure under control, and if: ? Your top number is above 130. ? Your bottom number is above 80.   Your personal target blood pressure may vary. Follow these instructions at home: Eating and drinking   If told, follow the DASH eating plan. To follow this plan: ? Fill one half of your plate at each meal with fruits and vegetables. ? Fill one fourth of your plate at each meal with whole grains. Whole grains include whole-wheat pasta, brown rice, and whole-grain bread. ? Eat or drink low-fat dairy products, such as skim milk or low-fat yogurt. ? Fill one fourth of your plate at each meal with low-fat (lean) proteins. Low-fat proteins include fish, chicken without skin, eggs, beans, and tofu. ? Avoid fatty meat, cured and processed meat, or chicken with skin. ? Avoid pre-made or processed food.  Eat less than 1,500 mg of salt each day.  Do not drink alcohol if: ? Your doctor tells you not to drink. ? You are pregnant, may be pregnant, or are planning to become pregnant.  If you drink alcohol: ? Limit how much you use to:  0-1 drink a day for women.  0-2 drinks a day for men. ? Be aware of how much alcohol is in your drink. In the [place] one drink equals one 12 oz bottle of beer (355 mL), one 5 oz glass of wine (148 mL), or one 1 oz glass of hard liquor (44 mL). Lifestyle   Work with your doctor to stay at a healthy weight or to lose weight. Ask your doctor what the best weight is for you.  Get at least 30 minutes of exercise most   days of the week. This may include walking, swimming, or biking.  Get at least 30 minutes of exercise that strengthens your muscles (resistance exercise) at least 3 days a week. This may include lifting weights or doing Pilates.  Do not use any products that contain nicotine or tobacco, such as cigarettes, e-cigarettes, and chewing tobacco. If you need help quitting, ask your doctor.  Check your blood pressure at home as told by your doctor.  Keep all follow-up visits as told by your doctor. This is important. Medicines  Take over-the-counter and  prescription medicines only as told by your doctor. Follow directions carefully.  Do not skip doses of blood pressure medicine. The medicine does not work as well if you skip doses. Skipping doses also puts you at risk for problems.  Ask your doctor about side effects or reactions to medicines that you should watch for. Contact a doctor if you:  Think you are having a reaction to the medicine you are taking.  Have headaches that keep coming back (recurring).  Feel dizzy.  Have swelling in your ankles.  Have trouble with your vision. Get help right away if you:  Get a very bad headache.  Start to feel mixed up (confused).  Feel weak or numb.  Feel faint.  Have very bad pain in your: ? Chest. ? Belly (abdomen).  Throw up more than once.  Have trouble breathing. Summary  Hypertension is another name for high blood pressure.  High blood pressure forces your heart to work harder to pump blood.  For most people, a normal blood pressure is less than 120/80.  Making healthy choices can help lower blood pressure. If your blood pressure does not get lower with healthy choices, you may need to take medicine. This information is not intended to replace advice given to you by your health care provider. Make sure you discuss any questions you have with your health care provider. Document Released: 02/03/2008 Document Revised: 04/27/2018 Document Reviewed: 04/27/2018 Elsevier Patient Education  2020 Elsevier Inc. Amlodipine; Hydrochlorothiazide, HCTZ; Valsartan tablets What is this medicine? AMLODIPINE; HYDROCHLOROTHIAZIDE, HCTZ; VALSARTAN (am LOE di peen; hye droe klor oh THYE a zide; val SAR tan) is a combination of a calcium channel blocker, a diuretic, and an angiotensin II antagonist. This medicine is used to treat high blood pressure. This medicine may be used for other purposes; ask your health care provider or pharmacist if you have questions. COMMON BRAND NAME(S): Exforge  HCT What should I tell my health care provider before I take this medicine? They need to know if you have any of these conditions:  decreased urine  diabetes  heart failure, recent heart attack, or other heart problems  if you are on a special diet, like a low salt diet  immune system problems, like lupus  kidney disease  liver disease  an unusual or allergic reaction to amlodipine, hydrochlorothiazide, sulfa drugs, valsartan, other medicines, foods, dyes, or preservatives  pregnant or trying to get pregnant  breast-feeding How should I use this medicine? Take this medicine by mouth with a glass of water. Follow the directions on the prescription label. You can take it with or without food. If it upsets your stomach, take it with food. Take your medicine at regular intervals. Do not take it more often than directed. Do not stop taking except on your doctor's advice. Talk to your pediatrician regarding the use of this medicine in children. Special care may be needed. Patients over 45 years old  may have a stronger reaction and need a smaller dose. Overdosage: If you think you have taken too much of this medicine contact a poison control center or emergency room at once. NOTE: This medicine is only for you. Do not share this medicine with others. What if I miss a dose? If you miss a dose, take it as soon as you can. If it is almost time for your next dose, take only that dose. Do not take double or extra doses. What may interact with this medicine?  alcohol  barbiturates like phenobarbital  carbamazepine  diuretics like triamterene, spironolactone, or amiloride  lithium  medicines for blood pressure  medicines for diabetes  NSAIDS, medicines for pain and inflammation, like ibuprofen or naproxen  potassium salts or potassium supplements  prescription pain medicines  skeletal muscle relaxants like tubocurarine  some cholesterol lowering medicines like cholestyramine  or colestipol  steroid medicines like prednisone or cortisone This list may not describe all possible interactions. Give your health care provider a list of all the medicines, herbs, non-prescription drugs, or dietary supplements you use. Also tell them if you smoke, drink alcohol, or use illegal drugs. Some items may interact with your medicine. What should I watch for while using this medicine? Visit your doctor or health care provider for regular checks on your progress. Check your blood pressure as directed. Ask your doctor or health care provider what your blood pressure should be and when you should contact him or her. You must not get dehydrated. Ask your doctor or health care provider how much fluid you need to drink a day. Check with him or her if you get an attack of severe diarrhea, nausea and vomiting, or if you sweat a lot. The loss of too much body fluid can make it dangerous for you to take this medicine. Women should inform their doctor if they wish to become pregnant or think they might be pregnant. There is a potential for serious side effects to an unborn child. Talk to your health care provider or pharmacist for more information. You may get drowsy or dizzy. Do not drive, use machinery, or do anything that needs mental alertness until you know how this medicine affects you. Do not stand or sit up quickly, especially if you are an older patient. This reduces the risk of dizzy or fainting spells. Alcohol may interfere with the effect of this medicine. Avoid alcoholic drinks. This medicine may increase blood sugar. Ask your healthcare provider if changes in diet or medicines are needed if you have diabetes. Avoid salt substitutes unless you are told otherwise by your doctor or health care provider. This medicine can make you more sensitive to the sun. Keep out of the sun. If you cannot avoid being in the sun, wear protective clothing and use sunscreen. Do not use sun lamps or tanning  beds/booths. Do not treat yourself for coughs, colds, or pain while you are taking this medicine without asking your doctor or health care provider for advice. Some ingredients may increase your blood pressure. If you are going to have surgery or dialysis, tell your doctor or health care provider that you are taking this medicine. What side effects may I notice from receiving this medicine? Side effects that you should report to your doctor or health care professional as soon as possible:  allergic reactions like skin rash, itching or hives, swelling of the face, lips, or tongue  breathing problems  changes in vision  chest pain  confusion  dark urine  eye pain  fast, irregular heartbeat  feeling faint or lightheaded, falls  low blood pressure  muscle cramps  redness, blistering, peeling or loosening of the skin, including inside the mouth  signs and symptoms of high blood sugar such as being more thirsty or hungry or having to urinate more than normal. You may also feel very tired or have blurry vision.  stomach pain  swelling of the hands, ankles, or feet  trouble passing urine or change in the amount of urine  worsened gout pain  yellowing of the eyes or skin Side effects that usually do not require medical attention (report to your doctor or health care professional if they continue or are bothersome):  change in sex drive or performance  cough  diarrhea  flushing of face, skin  headache  nausea, vomiting  stomach gas, pain  weak or tired This list may not describe all possible side effects. Call your doctor for medical advice about side effects. You may report side effects to FDA at 1-800-FDA-1088. Where should I keep my medicine? Keep out of the reach of children. Store at room temperature between 15 and 30 degrees C (59 and 86 degrees F). Protect from moisture. Throw away any unused medicine after the expiration date. NOTE: This sheet is a summary.  It may not cover all possible information. If you have questions about this medicine, talk to your doctor, pharmacist, or health care provider.  2020 Elsevier/Gold Standard (2018-11-07 12:07:17)

## 2019-06-04 LAB — URINE CULTURE: Organism ID, Bacteria: NO GROWTH

## 2019-08-02 ENCOUNTER — Ambulatory Visit: Payer: Self-pay | Admitting: Family Medicine

## 2019-08-22 ENCOUNTER — Ambulatory Visit: Payer: HRSA Program | Attending: Internal Medicine

## 2019-08-22 DIAGNOSIS — Z20822 Contact with and (suspected) exposure to covid-19: Secondary | ICD-10-CM

## 2019-08-22 DIAGNOSIS — Z20828 Contact with and (suspected) exposure to other viral communicable diseases: Secondary | ICD-10-CM | POA: Diagnosis present

## 2019-08-24 LAB — NOVEL CORONAVIRUS, NAA: SARS-CoV-2, NAA: NOT DETECTED

## 2019-12-29 ENCOUNTER — Encounter: Payer: Self-pay | Admitting: Nurse Practitioner

## 2019-12-29 ENCOUNTER — Other Ambulatory Visit: Payer: Self-pay

## 2019-12-29 ENCOUNTER — Ambulatory Visit (INDEPENDENT_AMBULATORY_CARE_PROVIDER_SITE_OTHER): Payer: HRSA Program | Admitting: Nurse Practitioner

## 2019-12-29 VITALS — BP 154/93 | HR 70 | Ht 64.0 in | Wt 208.4 lb

## 2019-12-29 DIAGNOSIS — Z0000 Encounter for general adult medical examination without abnormal findings: Secondary | ICD-10-CM

## 2019-12-29 DIAGNOSIS — Z716 Tobacco abuse counseling: Secondary | ICD-10-CM

## 2019-12-29 DIAGNOSIS — N760 Acute vaginitis: Secondary | ICD-10-CM

## 2019-12-29 DIAGNOSIS — I10 Essential (primary) hypertension: Secondary | ICD-10-CM

## 2019-12-29 LAB — POCT URINALYSIS DIPSTICK
Bilirubin, UA: NEGATIVE
Blood, UA: NEGATIVE
Glucose, UA: NEGATIVE
Ketones, UA: NEGATIVE
Nitrite, UA: NEGATIVE
Protein, UA: POSITIVE — AB
Spec Grav, UA: 1.025 (ref 1.010–1.025)
Urobilinogen, UA: 1 E.U./dL
pH, UA: 6.5 (ref 5.0–8.0)

## 2019-12-29 MED ORDER — NICOTINE 21 MG/24HR TD PT24: 21.0000 mg | MEDICATED_PATCH | Freq: Every day | TRANSDERMAL | 1 refills | Status: DC

## 2019-12-29 MED ORDER — AMLODIPINE BESYLATE 5 MG PO TABS: 5.0000 mg | ORAL_TABLET | Freq: Every day | ORAL | 3 refills | Status: DC

## 2019-12-29 MED ORDER — HYDROCHLOROTHIAZIDE 25 MG PO TABS: 25.0000 mg | ORAL_TABLET | Freq: Every day | ORAL | 2 refills | Status: DC

## 2019-12-29 MED FILL — AMLODIPINE BESYLATE 5 MG TA: 5 | 90 days supply | Qty: 90 | Fill #0

## 2019-12-29 MED FILL — HYDROCHLOROTHIAZIDE 25 MG T: 25 | 30 days supply | Qty: 30 | Fill #0

## 2019-12-29 NOTE — Progress Notes (Addendum)
[facility] Patient [facility] [address] [place], [place]  [address] Phone:  [phone]   Fax:  [phone]   Established Patient Office Visit  Subjective:  Patient ID: Victoria Carpenter, female    DOB: 04/17/1976  Age: 44 y.o. MRN: [id number]  CC:  Chief Complaint  Patient presents with  . Vaginal Discharge    has some  thick discharge on wednesday ,     HPI Victoria Carpenter presents for follow up. She  has a past medical history of Hypertension, Vaginal delivery, and Vaginitis due to Gardnerella vaginalis (2010).   Hypertension Patient is here for follow-up of elevated blood pressure. She is not exercising and is adherent to a low-salt diet.  She has started doing calorie counting and increasing her water intake.  She was on hydrochlorothiazide however has not taken in some time since her prescription was completed.  She did notice that she felt better while using the hydrochlorothiazide.  Blood pressure is well controlled at home. Cardiac symptoms: none. Patient denies chest pain, dyspnea, fatigue, irregular heart beat, lower extremity edema and palpitations. Cardiovascular risk factors: obesity (BMI >= 30 kg/m2) and sedentary lifestyle. Use of agents associated with hypertension: none. History of target organ damage: none.  She admits that on Wednesday she noticed some thick mucus type discharge.  She admits that she is ovulating.  She notice it has resolved.  However she wanted to be certain there is nothing going on.  Started menses at age  21.  She denies any pelvic pain or tenderness, amenorrhea irregular bleeding or prolonged heavy bleeding.  She denies dysuria.  Denies ulcers or lesions    Past Medical History:  Diagnosis Date  . Hypertension   . Vaginal delivery    x 2  . Vaginitis due to Gardnerella vaginalis 2010    No past surgical history on file.  Family History  Problem Relation Age of Onset  . Hypertension Mother   . Hypertension Father      Social History   Socioeconomic History  . Marital status: Single    Spouse name: Not on file  . Number of children: Not on file  . Years of education: Not on file  . Highest education level: Not on file  Occupational History  . Not on file  Tobacco Use  . Smoking status: Current Every Day Smoker    Packs/day: 0.25    Types: Cigarettes  . Smokeless tobacco: Never Used  . Tobacco comment: afraid she will gain weight  Substance and Sexual Activity  . Alcohol use: No  . Drug use: No  . Sexual activity: Yes    Birth control/protection: Condom  Other Topics Concern  . Not on file  Social History Narrative   Home Health, private sitter   Social Determinants of Health   Financial Resource Strain:   . Difficulty of Paying Living Expenses:   Food Insecurity:   . Worried About Programme researcher, broadcasting/film/video in the Last Year:   . Barista in the Last Year:   Transportation Needs:   . Freight forwarder (Medical):   Marland Kitchen Lack of Transportation (Non-Medical):   Physical Activity:   . Days of Exercise per Week:   . Minutes of Exercise per Session:   Stress:   . Feeling of Stress :   Social Connections:   . Frequency of Communication with Friends and Family:   . Frequency of Social Gatherings with Friends and Family:   .  Attends Religious Services:   . Active Member of Clubs or Organizations:   . Attends Archivist Meetings:   Marland Kitchen Marital Status:   Intimate Partner Violence:   . Fear of Current or Ex-Partner:   . Emotionally Abused:   Marland Kitchen Physically Abused:   . Sexually Abused:     Outpatient Medications Prior to Visit  Medication Sig Dispense Refill  . ibuprofen (ADVIL,MOTRIN) 200 MG tablet Take 200 mg by mouth every 6 (six) hours as needed.    Marland Kitchen amLODipine (NORVASC) 5 MG tablet Take 1 tablet (5 mg total) by mouth daily. 90 tablet 3  . hydrochlorothiazide (HYDRODIURIL) 25 MG tablet Take 1 tablet (25 mg total) by mouth daily. (Patient not taking: Reported on  12/29/2019) 30 tablet 2   No facility-administered medications prior to visit.    No Known Allergies  ROS Review of Systems  All other systems reviewed and are negative.     Objective:    Physical Exam  Constitutional: She is oriented to person, place, and time. She appears well-developed and well-nourished.  HENT:  Head: Normocephalic.  Eyes: Pupils are equal, round, and reactive to light.  Cardiovascular: Normal rate, regular rhythm, normal heart sounds and intact distal pulses.  Pulmonary/Chest: Effort normal.  Decreased breath sounds   Abdominal: Soft. Bowel sounds are normal.  Musculoskeletal:        General: Normal range of motion.     Cervical back: Normal range of motion.  Neurological: She is alert and oriented to person, place, and time.  Skin: Skin is warm and dry.  Psychiatric: She has a normal mood and affect. Her behavior is normal. Judgment and thought content normal.    BP (!) 154/93 (BP Location: Left Arm, Patient Position: Sitting, Cuff Size: Large)   Pulse 70   Ht 5\' 4"  (1.626 m)   Wt 208 lb 6.4 oz (94.5 kg)   LMP 12/09/2019   SpO2 99%   BMI 35.77 kg/m  Wt Readings from Last 3 Encounters:  12/29/19 208 lb 6.4 oz (94.5 kg)  06/02/19 210 lb 6.4 oz (95.4 kg)  03/02/19 208 lb (94.3 kg)     Health Maintenance Due  Topic Date Due  . COVID-19 Vaccine (1) Never done    There are no preventive care reminders to display for this patient.  Lab Results  Component Value Date   TSH 1.760 03/02/2019   Lab Results  Component Value Date   WBC 6.7 03/02/2019   HGB 12.8 03/02/2019   HCT 38.5 03/02/2019   MCV 87 03/02/2019   PLT 291 03/02/2019   Lab Results  Component Value Date   NA 139 03/02/2019   K 4.6 03/02/2019   CO2 22 03/02/2019   GLUCOSE 81 03/02/2019   BUN 7 03/02/2019   CREATININE 0.91 03/02/2019   BILITOT 0.3 03/02/2019   ALKPHOS 72 03/02/2019   AST 9 03/02/2019   ALT 7 03/02/2019   PROT 6.8 03/02/2019   ALBUMIN 4.0 03/02/2019    CALCIUM 9.3 03/02/2019   Lab Results  Component Value Date   CHOL 189 03/02/2019   Lab Results  Component Value Date   HDL 37 (L) 03/02/2019   Lab Results  Component Value Date   LDLCALC 128 (H) 03/02/2019   Lab Results  Component Value Date   TRIG 122 03/02/2019   Lab Results  Component Value Date   CHOLHDL 5.1 (H) 03/02/2019   Lab Results  Component Value Date   HGBA1C 5.4 09/23/2017  Assessment & Plan:   Problem List Items Addressed This Visit      Unprioritized   Essential hypertension - Primary   Relevant Medications   hydrochlorothiazide (HYDRODIURIL) 25 MG tablet   amLODipine (NORVASC) 5 MG tablet   Other Relevant Orders   POCT Urinalysis Dipstick (Completed)   Comp. Metabolic Panel (12)   Lipid panel    Other Visit Diagnoses    Acute vaginitis       New swab pending   Relevant Orders   NuSwab Vaginitis Plus (VG+)   Urine Culture   Tobacco abuse counseling       Counseling provided.  Patient to set target date and use NicoDerm if needed   Relevant Medications   nicotine (NICODERM CQ) 21 mg/24hr patch   Healthcare maintenance       Relevant Orders   TSH   Vitamin B12      Meds ordered this encounter  Medications  . nicotine (NICODERM CQ) 21 mg/24hr patch    Sig: Place 1 patch (21 mg total) onto the skin daily.    Dispense:  30 patch    Refill:  1    Order Specific Question:   Supervising Provider    Answer:   Quentin Angst [id number]  . hydrochlorothiazide (HYDRODIURIL) 25 MG tablet    Sig: Take 1 tablet (25 mg total) by mouth daily.    Dispense:  30 tablet    Refill:  2    Order Specific Question:   Supervising Provider    Answer:   Quentin Angst [id number]  . amLODipine (NORVASC) 5 MG tablet    Sig: Take 1 tablet (5 mg total) by mouth daily.    Dispense:  90 tablet    Refill:  3    Order Specific Question:   Supervising Provider    Answer:   Quentin Angst [id number]    Follow-up: Return in about 3 months  (around 03/29/2020).    Barbette Merino, NP

## 2019-12-29 NOTE — Patient Instructions (Signed)
Steps to Quit Smoking Smoking tobacco is the leading cause of preventable death. It can affect almost every organ in the body. Smoking puts you and people around you at risk for many serious, long-lasting (chronic) diseases. Quitting smoking can be hard, but it is one of the best things that you can do for your health. It is never too late to quit. How do I get ready to quit? When you decide to quit smoking, make a plan to help you succeed. Before you quit:  Pick a date to quit. Set a date within the next 2 weeks to give you time to prepare.  Write down the reasons why you are quitting. Keep this list in places where you will see it often.  Tell your family, friends, and co-workers that you are quitting. Their support is important.  Talk with your doctor about the choices that may help you quit.  Find out if your health insurance will pay for these treatments.  Know the people, places, things, and activities that make you want to smoke (triggers). Avoid them. What first steps can I take to quit smoking?  Throw away all cigarettes at home, at work, and in your car.  Throw away the things that you use when you smoke, such as ashtrays and lighters.  Clean your car. Make sure to empty the ashtray.  Clean your home, including curtains and carpets. What can I do to help me quit smoking? Talk with your doctor about taking medicines and seeing a counselor at the same time. You are more likely to succeed when you do both.  If you are pregnant or breastfeeding, talk with your doctor about counseling or other ways to quit smoking. Do not take medicine to help you quit smoking unless your doctor tells you to do so. To quit smoking: Quit right away  Quit smoking totally, instead of slowly cutting back on how much you smoke over a period of time.  Go to counseling. You are more likely to quit if you go to counseling sessions regularly. Take medicine You may take medicines to help you quit. Some  medicines need a prescription, and some you can buy over-the-counter. Some medicines may contain a drug called nicotine to replace the nicotine in cigarettes. Medicines may:  Help you to stop having the desire to smoke (cravings).  Help to stop the problems that come when you stop smoking (withdrawal symptoms). Your doctor may ask you to use:  Nicotine patches, gum, or lozenges.  Nicotine inhalers or sprays.  Non-nicotine medicine that is taken by mouth. Find resources Find resources and other ways to help you quit smoking and remain smoke-free after you quit. These resources are most helpful when you use them often. They include:  Online chats with a counselor.  Phone quitlines.  Printed self-help materials.  Support groups or group counseling.  Text messaging programs.  Mobile phone apps. Use apps on your mobile phone or tablet that can help you stick to your quit plan. There are many free apps for mobile phones and tablets as well as websites. Examples include Quit Guide from the CDC and smokefree.gov  What things can I do to make it easier to quit?   Talk to your family and friends. Ask them to support and encourage you.  Call a phone quitline (1-800-QUIT-NOW), reach out to support groups, or work with a counselor.  Ask people who smoke to not smoke around you.  Avoid places that make you want to smoke,   such as: ? Bars. ? Parties. ? Smoke-break areas at work.  Spend time with people who do not smoke.  Lower the stress in your life. Stress can make you want to smoke. Try these things to help your stress: ? Getting regular exercise. ? Doing deep-breathing exercises. ? Doing yoga. ? Meditating. ? Doing a body scan. To do this, close your eyes, focus on one area of your body at a time from head to toe. Notice which parts of your body are tense. Try to relax the muscles in those areas. How will I feel when I quit smoking? Day 1 to 3 weeks Within the first 24 hours,  you may start to have some problems that come from quitting tobacco. These problems are very bad 2-3 days after you quit, but they do not often last for more than 2-3 weeks. You may get these symptoms:  Mood swings.  Feeling restless, nervous, angry, or annoyed.  Trouble concentrating.  Dizziness.  Strong desire for high-sugar foods and nicotine.  Weight gain.  Trouble pooping (constipation).  Feeling like you may vomit (nausea).  Coughing or a sore throat.  Changes in how the medicines that you take for other issues work in your body.  Depression.  Trouble sleeping (insomnia). Week 3 and afterward After the first 2-3 weeks of quitting, you may start to notice more positive results, such as:  Better sense of smell and taste.  Less coughing and sore throat.  Slower heart rate.  Lower blood pressure.  Clearer skin.  Better breathing.  Fewer sick days. Quitting smoking can be hard. Do not give up if you fail the first time. Some people need to try a few times before they succeed. Do your best to stick to your quit plan, and talk with your doctor if you have any questions or concerns. Summary  Smoking tobacco is the leading cause of preventable death. Quitting smoking can be hard, but it is one of the best things that you can do for your health.  When you decide to quit smoking, make a plan to help you succeed.  Quit smoking right away, not slowly over a period of time.  When you start quitting, seek help from your doctor, family, or friends. This information is not intended to replace advice given to you by your health care provider. Make sure you discuss any questions you have with your health care provider. Document Revised: 05/12/2019 Document Reviewed: 11/05/2018 Elsevier Patient Education  2020 Elsevier Inc.    Managing Your Hypertension Hypertension is commonly called high blood pressure. This is when the force of your blood pressing against the walls of  your arteries is too strong. Arteries are blood vessels that carry blood from your heart throughout your body. Hypertension forces the heart to work harder to pump blood, and may cause the arteries to become narrow or stiff. Having untreated or uncontrolled hypertension can cause heart attack, stroke, kidney disease, and other problems. What are blood pressure readings? A blood pressure reading consists of a higher number over a lower number. Ideally, your blood pressure should be below 120/80. The first ("top") number is called the systolic pressure. It is a measure of the pressure in your arteries as your heart beats. The second ("bottom") number is called the diastolic pressure. It is a measure of the pressure in your arteries as the heart relaxes. What does my blood pressure reading mean? Blood pressure is classified into four stages. Based on your blood pressure reading,   your health care provider may use the following stages to determine what type of treatment you need, if any. Systolic pressure and diastolic pressure are measured in a unit called mm Hg. Normal  Systolic pressure: below 120.  Diastolic pressure: below 80. Elevated  Systolic pressure: 120-129.  Diastolic pressure: below 80. Hypertension stage 1  Systolic pressure: 130-139.  Diastolic pressure: 80-89. Hypertension stage 2  Systolic pressure: 140 or above.  Diastolic pressure: 90 or above. What health risks are associated with hypertension? Managing your hypertension is an important responsibility. Uncontrolled hypertension can lead to:  A heart attack.  A stroke.  A weakened blood vessel (aneurysm).  Heart failure.  Kidney damage.  Eye damage.  Metabolic syndrome.  Memory and concentration problems. What changes can I make to manage my hypertension? Hypertension can be managed by making lifestyle changes and possibly by taking medicines. Your health care provider will help you make a plan to bring your  blood pressure within a normal range. Eating and drinking   Eat a diet that is high in fiber and potassium, and low in salt (sodium), added sugar, and fat. An example eating plan is called the DASH (Dietary Approaches to Stop Hypertension) diet. To eat this way: ? Eat plenty of fresh fruits and vegetables. Try to fill half of your plate at each meal with fruits and vegetables. ? Eat whole grains, such as whole wheat pasta, brown rice, or whole grain bread. Fill about one quarter of your plate with whole grains. ? Eat low-fat diary products. ? Avoid fatty cuts of meat, processed or cured meats, and poultry with skin. Fill about one quarter of your plate with lean proteins such as fish, chicken without skin, beans, eggs, and tofu. ? Avoid premade and processed foods. These tend to be higher in sodium, added sugar, and fat.  Reduce your daily sodium intake. Most people with hypertension should eat less than 1,500 mg of sodium a day.  Limit alcohol intake to no more than 1 drink a day for nonpregnant women and 2 drinks a day for men. One drink equals 12 oz of beer, 5 oz of wine, or 1 oz of hard liquor. Lifestyle  Work with your health care provider to maintain a healthy body weight, or to lose weight. Ask what an ideal weight is for you.  Get at least 30 minutes of exercise that causes your heart to beat faster (aerobic exercise) most days of the week. Activities may include walking, swimming, or biking.  Include exercise to strengthen your muscles (resistance exercise), such as weight lifting, as part of your weekly exercise routine. Try to do these types of exercises for 30 minutes at least 3 days a week.  Do not use any products that contain nicotine or tobacco, such as cigarettes and e-cigarettes. If you need help quitting, ask your health care provider.  Control any long-term (chronic) conditions you have, such as high cholesterol or diabetes. Monitoring  Monitor your blood pressure at  home as told by your health care provider. Your personal target blood pressure may vary depending on your medical conditions, your age, and other factors.  Have your blood pressure checked regularly, as often as told by your health care provider. Working with your health care provider  Review all the medicines you take with your health care provider because there may be side effects or interactions.  Talk with your health care provider about your diet, exercise habits, and other lifestyle factors that may be contributing   to hypertension.  Visit your health care provider regularly. Your health care provider can help you create and adjust your plan for managing hypertension. Will I need medicine to control my blood pressure? Your health care provider may prescribe medicine if lifestyle changes are not enough to get your blood pressure under control, and if:  Your systolic blood pressure is 130 or higher.  Your diastolic blood pressure is 80 or higher. Take medicines only as told by your health care provider. Follow the directions carefully. Blood pressure medicines must be taken as prescribed. The medicine does not work as well when you skip doses. Skipping doses also puts you at risk for problems. Contact a health care provider if:  You think you are having a reaction to medicines you have taken.  You have repeated (recurrent) headaches.  You feel dizzy.  You have swelling in your ankles.  You have trouble with your vision. Get help right away if:  You develop a severe headache or confusion.  You have unusual weakness or numbness, or you feel faint.  You have severe pain in your chest or abdomen.  You vomit repeatedly.  You have trouble breathing. Summary  Hypertension is when the force of blood pumping through your arteries is too strong. If this condition is not controlled, it may put you at risk for serious complications.  Your personal target blood pressure may vary  depending on your medical conditions, your age, and other factors. For most people, a normal blood pressure is less than 120/80.  Hypertension is managed by lifestyle changes, medicines, or both. Lifestyle changes include weight loss, eating a healthy, low-sodium diet, exercising more, and limiting alcohol. This information is not intended to replace advice given to you by your health care provider. Make sure you discuss any questions you have with your health care provider. Document Revised: 12/09/2018 Document Reviewed: 07/15/2016 Elsevier Patient Education  2020 Elsevier Inc.  

## 2019-12-30 LAB — COMP. METABOLIC PANEL (12)
AST: 10 IU/L (ref 0–40)
Albumin/Globulin Ratio: 1.5 (ref 1.2–2.2)
Albumin: 4.1 g/dL (ref 3.8–4.8)
Alkaline Phosphatase: 76 IU/L (ref 39–117)
BUN/Creatinine Ratio: 8 — ABNORMAL LOW (ref 9–23)
BUN: 7 mg/dL (ref 6–24)
Bilirubin Total: 0.5 mg/dL (ref 0.0–1.2)
Calcium: 9.1 mg/dL (ref 8.7–10.2)
Chloride: 107 mmol/L — ABNORMAL HIGH (ref 96–106)
Creatinine, Ser: 0.9 mg/dL (ref 0.57–1.00)
GFR calc Af Amer: 89 mL/min/{1.73_m2} (ref >59)
GFR calc non Af Amer: 77 mL/min/{1.73_m2} (ref >59)
Globulin, Total: 2.7 g/dL (ref 1.5–4.5)
Glucose: 85 mg/dL (ref 65–99)
Potassium: 4.6 mmol/L (ref 3.5–5.2)
Sodium: 141 mmol/L (ref 134–144)
Total Protein: 6.8 g/dL (ref 6.0–8.5)

## 2019-12-30 LAB — LIPID PANEL
Chol/HDL Ratio: 4.5 ratio — ABNORMAL HIGH (ref 0.0–4.4)
Cholesterol, Total: 172 mg/dL (ref 100–199)
HDL: 38 mg/dL — ABNORMAL LOW (ref >39)
LDL Chol Calc (NIH): 119 mg/dL — ABNORMAL HIGH (ref 0–99)
Triglycerides: 81 mg/dL (ref 0–149)
VLDL Cholesterol Cal: 15 mg/dL (ref 5–40)

## 2019-12-30 LAB — TSH: TSH: 1.44 u[IU]/mL (ref 0.450–4.500)

## 2019-12-30 LAB — VITAMIN B12: Vitamin B-12: 294 pg/mL (ref 232–1245)

## 2019-12-31 LAB — URINE CULTURE

## 2020-01-01 ENCOUNTER — Other Ambulatory Visit: Payer: Self-pay | Admitting: Nurse Practitioner

## 2020-01-01 LAB — NUSWAB VAGINITIS PLUS (VG+)
Atopobium vaginae: HIGH Score — AB
Candida albicans, NAA: POSITIVE — AB
Candida glabrata, NAA: NEGATIVE
Chlamydia trachomatis, NAA: NEGATIVE
Neisseria gonorrhoeae, NAA: NEGATIVE
Trich vag by NAA: POSITIVE — AB

## 2020-01-01 MED ORDER — METRONIDAZOLE 500 MG PO TABS: 500.0000 mg | ORAL_TABLET | Freq: Three times a day (TID) | ORAL | 0 refills | Status: AC

## 2020-01-01 MED ORDER — FLUCONAZOLE 150 MG PO TABS
150.0000 mg | ORAL_TABLET | Freq: Once | ORAL | 0 refills | Status: AC
Start: 2020-01-01 — End: 2020-01-01

## 2020-01-01 MED FILL — METRONIDAZOLE 500 MG TABS: 500 | 7 days supply | Qty: 21 | Fill #0

## 2020-01-01 MED FILL — FLUCONAZOLE 150 MG TABS: 150 | 1 days supply | Qty: 1 | Fill #0

## 2020-01-01 NOTE — Progress Notes (Signed)
Please make Ms. Louis aware that she did have vaginitis and Candida.  I will send in the Flagyl and the Diflucan.  All other labs are stable.  Thanks

## 2020-02-05 ENCOUNTER — Other Ambulatory Visit: Payer: Self-pay

## 2020-02-05 ENCOUNTER — Encounter (HOSPITAL_BASED_OUTPATIENT_CLINIC_OR_DEPARTMENT_OTHER): Payer: Self-pay

## 2020-02-05 ENCOUNTER — Emergency Department (HOSPITAL_BASED_OUTPATIENT_CLINIC_OR_DEPARTMENT_OTHER)
Admission: EM | Admit: 2020-02-05 | Discharge: 2020-02-06 | Disposition: A | Discharge status: Home / Self Care | Payer: 59 | Attending: Emergency Medicine | Admitting: Emergency Medicine

## 2020-02-05 DIAGNOSIS — Z79899 Other long term (current) drug therapy: Secondary | ICD-10-CM | POA: Diagnosis not present

## 2020-02-05 DIAGNOSIS — R197 Diarrhea, unspecified: Secondary | ICD-10-CM | POA: Diagnosis not present

## 2020-02-05 DIAGNOSIS — R1010 Upper abdominal pain, unspecified: Secondary | ICD-10-CM

## 2020-02-05 DIAGNOSIS — R1011 Right upper quadrant pain: Secondary | ICD-10-CM | POA: Insufficient documentation

## 2020-02-05 DIAGNOSIS — F17210 Nicotine dependence, cigarettes, uncomplicated: Secondary | ICD-10-CM | POA: Diagnosis not present

## 2020-02-05 DIAGNOSIS — I10 Essential (primary) hypertension: Secondary | ICD-10-CM | POA: Insufficient documentation

## 2020-02-05 DIAGNOSIS — R112 Nausea with vomiting, unspecified: Secondary | ICD-10-CM | POA: Insufficient documentation

## 2020-02-05 DIAGNOSIS — R1013 Epigastric pain: Secondary | ICD-10-CM | POA: Insufficient documentation

## 2020-02-05 LAB — URINALYSIS, ROUTINE W REFLEX MICROSCOPIC
Bilirubin Urine: NEGATIVE
Glucose, UA: NEGATIVE mg/dL
Hgb urine dipstick: NEGATIVE
Ketones, ur: 15 mg/dL — AB
Leukocytes,Ua: NEGATIVE
Nitrite: NEGATIVE
Protein, ur: NEGATIVE mg/dL
Specific Gravity, Urine: >1.03 — ABNORMAL HIGH (ref 1.005–1.030)
pH: 5.5 (ref 5.0–8.0)

## 2020-02-05 LAB — PREGNANCY, URINE: Preg Test, Ur: NEGATIVE

## 2020-02-05 MED ORDER — ONDANSETRON HCL 4 MG/2ML IJ SOLN
4.0000 mg | Freq: Once | INTRAMUSCULAR | Status: AC
  Administered 2020-02-06: 4 mg via INTRAVENOUS
  Filled 2020-02-05: qty 2

## 2020-02-05 MED ORDER — LIDOCAINE VISCOUS HCL 2 % MT SOLN
15.0000 mL | Freq: Once | OROMUCOSAL | Status: AC
  Administered 2020-02-06: 15 mL via ORAL
  Filled 2020-02-05: qty 15

## 2020-02-05 MED ORDER — HYOSCYAMINE SULFATE 0.125 MG SL SUBL
0.2500 mg | SUBLINGUAL_TABLET | Freq: Once | SUBLINGUAL | Status: AC
  Administered 2020-02-06: 0.25 mg via SUBLINGUAL
  Filled 2020-02-05: qty 2

## 2020-02-05 MED ORDER — ALUM & MAG HYDROXIDE-SIMETH 200-200-20 MG/5ML PO SUSP
30.0000 mL | Freq: Once | ORAL | Status: AC
  Administered 2020-02-06: 30 mL via ORAL
  Filled 2020-02-05: qty 30

## 2020-02-05 NOTE — ED Provider Notes (Signed)
[facility] EMERGENCY DEPARTMENT Provider Note  CSN: [id number] Arrival date & time: 02/05/20 2132  Chief Complaint(s) Abdominal Pain  HPI Victoria Carpenter is a 44 y.o. female   The history is provided by the patient.  Abdominal Pain Pain location:  Epigastric and RUQ Pain quality: aching and cramping   Pain radiates to:  Does not radiate Pain severity:  Moderate Onset quality:  Gradual Duration:  3 days Timing:  Constant Progression:  Waxing and waning Chronicity:  New Relieved by:  Nothing Worsened by:  Eating Associated symptoms: diarrhea, nausea and vomiting   Associated symptoms: no chest pain, no chills, no fatigue, no fever and no shortness of breath     Past Medical History Past Medical History:  Diagnosis Date  . Hypertension   . Vaginal delivery    x 2  . Vaginitis due to Gardnerella vaginalis 2010   Patient Active Problem List   Diagnosis Date Noted  . Tobacco dependence 06/09/2015  . Essential hypertension 06/07/2015  . Pap smear for cervical cancer screening 01/21/2011  . Need for tetanus booster 01/21/2011   Home Medication(s) Prior to Admission medications   Medication Sig Start Date End Date Taking? Authorizing Provider  amLODipine (NORVASC) 5 MG tablet Take 1 tablet (5 mg total) by mouth daily. 12/29/19   Barbette Merino, NP  hydrochlorothiazide (HYDRODIURIL) 25 MG tablet Take 1 tablet (25 mg total) by mouth daily. 12/29/19   Barbette Merino, NP  ibuprofen (ADVIL,MOTRIN) 200 MG tablet Take 200 mg by mouth every 6 (six) hours as needed.    [provider]  nicotine (NICODERM CQ) 21 mg/24hr patch Place 1 patch (21 mg total) onto the skin daily. 12/29/19 02/27/20  Barbette Merino, NP  ondansetron (ZOFRAN ODT) 4 MG disintegrating tablet Take 1 tablet (4 mg total) by mouth every 8 (eight) hours as needed for up to 3 days for nausea or vomiting. 02/06/20 02/09/20  Nira Conn, MD                                                                                                                                     Past Surgical History History reviewed. No pertinent surgical history. Family History Family History  Problem Relation Age of Onset  . Hypertension Mother   . Hypertension Father     Social History Social History   Tobacco Use  . Smoking status: Current Every Day Smoker    Packs/day: 0.25    Types: Cigarettes  . Smokeless tobacco: Never Used  Substance Use Topics  . Alcohol use: No  . Drug use: No   Allergies Patient has no known allergies.  Review of Systems Review of Systems  Constitutional: Negative for chills, fatigue and fever.  Respiratory: Negative for shortness of breath.   Cardiovascular: Negative for chest pain.  Gastrointestinal: Positive for abdominal pain, diarrhea, nausea and vomiting.   All other systems are  reviewed and are negative for acute change except as noted in the HPI  Physical Exam Vital Signs  I have reviewed the triage vital signs BP (!) 145/98 (BP Location: Right Arm)   Pulse 71   Temp 98.9 F (37.2 C) (Oral)   Resp 14   LMP 01/29/2020   SpO2 94%   Physical Exam Vitals reviewed.  Constitutional:      General: She is not in acute distress.    Appearance: She is well-developed. She is not diaphoretic.  HENT:     Head: Normocephalic and atraumatic.     Nose: Nose normal.  Eyes:     General: No scleral icterus.       Right eye: No discharge.        Left eye: No discharge.     Conjunctiva/sclera: Conjunctivae normal.     Pupils: Pupils are equal, round, and reactive to light.  Cardiovascular:     Rate and Rhythm: Normal rate and regular rhythm.     Heart sounds: No murmur. No friction rub. No gallop.   Pulmonary:     Effort: Pulmonary effort is normal. No respiratory distress.     Breath sounds: Normal breath sounds. No stridor. No rales.  Abdominal:     General: There is no distension.     Palpations: Abdomen is soft.     Tenderness: There is  abdominal tenderness in the epigastric area. There is no guarding or rebound. Negative signs include Murphy's sign.  Musculoskeletal:        General: No tenderness.     Cervical back: Normal range of motion and neck supple.  Skin:    General: Skin is warm and dry.     Findings: No erythema or rash.  Neurological:     Mental Status: She is alert and oriented to person, place, and time.     ED Results and Treatments Labs (all labs ordered are listed, but only abnormal results are displayed) Labs Reviewed  URINALYSIS, ROUTINE W REFLEX MICROSCOPIC - Abnormal; Notable for the following components:      Result Value   Specific Gravity, Urine >1.030 (*)    Ketones, ur 15 (*)    All other components within normal limits  COMPREHENSIVE METABOLIC PANEL - Abnormal; Notable for the following components:   Glucose, Bld 107 (*)    Calcium 8.6 (*)    AST 10 (*)    All other components within normal limits  LIPASE, BLOOD - Abnormal; Notable for the following components:   Lipase 53 (*)    All other components within normal limits  PREGNANCY, URINE  CBC WITH DIFFERENTIAL/PLATELET                                                                                                                         EKG  EKG Interpretation  Date/Time:  Monday February 05 2020 21:59:35 EDT Ventricular Rate:  80 PR Interval:  174 QRS Duration: 80 QT Interval:  356  QTC Calculation: 410 R Axis:   65 Text Interpretation: Normal sinus rhythm Normal ECG motion artifact. Otherwise no significant change Confirmed by Drema Pry [phone]) on 02/06/2020 1:01:25 AM      Radiology No results found.  Pertinent labs & imaging results that were available during my care of the patient were reviewed by me and considered in my medical decision making (see chart for details).  Medications Ordered in ED Medications  ondansetron (ZOFRAN) injection 4 mg (4 mg Intravenous Given 02/06/20 0011)  alum & mag hydroxide-simeth  (MAALOX/MYLANTA) 200-200-20 MG/5ML suspension 30 mL (30 mLs Oral Given 02/06/20 0012)    And  lidocaine (XYLOCAINE) 2 % viscous mouth solution 15 mL (15 mLs Oral Given 02/06/20 0012)  hyoscyamine (LEVSIN SL) SL tablet 0.25 mg (0.25 mg Sublingual Given 02/06/20 0011)  ketorolac (TORADOL) 15 MG/ML injection 15 mg (15 mg Intravenous Given 02/06/20 0151)                                                                                                                                    Procedures Ultrasound ED Abd  Date/Time: 02/06/2020 2:45 AM Performed by: Nira Conn, MD Authorized by: Nira Conn, MD   Procedure details:    Indications: abdominal pain     Assessment for:  Gallstones   Hepatobiliary:  Visualized       Hepatobiliary findings:    Common bile duct:  Normal   Gallbladder wall:  Normal   Gallbladder stones: not identified     Intra-abdominal fluid: not identified     Sonographic Murphy's sign: negative      (including critical care time)  Medical Decision Making / ED Course I have reviewed the nursing notes for this encounter and the patient's prior records (if available in EHR or on provided paperwork).   Jasminemarie L Szostak was evaluated in Emergency Department on 02/06/2020 for the symptoms described in the history of present illness. She was evaluated in the context of the global COVID-19 pandemic, which necessitated consideration that the patient might be at risk for infection with the SARS-CoV-2 virus that causes COVID-19. Institutional protocols and algorithms that pertain to the evaluation of patients at risk for COVID-19 are in a state of rapid change based on information released by regulatory bodies including the CDC and federal and state organizations. These policies and algorithms were followed during the patient's care in the ED.  3 days of upper abdominal pain.  Tenderness to palpation in the epigastrium.  Not peritoneal take. Labs grossly  reassuring without leukocytosis or anemia.  No significant electrolyte derangement or renal sufficiency.  No evidence of biliary obstruction or pancreatitis. Bedside ultrasound without evidence of acute cholecystitis or cholelithiasis.  Gallbladder appears to be septated and decompressed likely from recent food intake. Low suspicion for serious intra-abdominal inflammatory/infectious process or bowel obstruction.  Possible gastritis, gastroenteritis, peptic ulcer disease.  Patient was treated symptomatically with antiemetics,  GI cocktail which provided some relief.  Given IV Toradol providing more significant relief.  Patient able to tolerate oral intake.      Final Clinical Impression(s) / ED Diagnoses Final diagnoses:  Upper abdominal pain   The patient appears reasonably screened and/or stabilized for discharge and I doubt any other medical condition or other [facility] requiring further screening, evaluation, or treatment in the ED at this time prior to discharge. Safe for discharge with strict return precautions.  Disposition: Discharge  Condition: Good  I have discussed the results, Dx and Tx plan with the patient/family who expressed understanding and agree(s) with the plan. Discharge instructions discussed at length. The patient/family was given strict return precautions who verbalized understanding of the instructions. No further questions at time of discharge.    ED Discharge Orders         Ordered    ondansetron (ZOFRAN ODT) 4 MG disintegrating tablet  Every 8 hours PRN     02/06/20 0244           Follow Up: Tresa Garter, MD Deputy  [id number] [phone]  Schedule an appointment as soon as possible for a visit  As needed      This chart was dictated using voice recognition software.  Despite best efforts to proofread,  errors can occur which can change the documentation meaning.   Fatima Blank, MD 02/06/20 [phone]

## 2020-02-05 NOTE — ED Triage Notes (Signed)
Pt c/o abd pain x 4 days-vomited x 1 today and loose stools x 4-radiates to lower back and to chest x today-to triage in w/c-NAD

## 2020-02-06 LAB — CBC WITH DIFFERENTIAL/PLATELET
Abs Immature Granulocytes: 0.03 10*3/uL (ref 0.00–0.07)
Basophils Absolute: 0 10*3/uL (ref 0.0–0.1)
Basophils Relative: 0 %
Eosinophils Absolute: 0.2 10*3/uL (ref 0.0–0.5)
Eosinophils Relative: 2 %
HCT: 37.9 % (ref 36.0–46.0)
Hemoglobin: 12.4 g/dL (ref 12.0–15.0)
Immature Granulocytes: 0 %
Lymphocytes Relative: 31 %
Lymphs Abs: 3.1 10*3/uL (ref 0.7–4.0)
MCH: 28.9 pg (ref 26.0–34.0)
MCHC: 32.7 g/dL (ref 30.0–36.0)
MCV: 88.3 fL (ref 80.0–100.0)
Monocytes Absolute: 0.6 10*3/uL (ref 0.1–1.0)
Monocytes Relative: 6 %
Neutro Abs: 6.1 10*3/uL (ref 1.7–7.7)
Neutrophils Relative %: 61 %
Platelets: 279 10*3/uL (ref 150–400)
RBC: 4.29 MIL/uL (ref 3.87–5.11)
RDW: 14.6 % (ref 11.5–15.5)
WBC: 10 10*3/uL (ref 4.0–10.5)
nRBC: 0 % (ref 0.0–0.2)

## 2020-02-06 LAB — COMPREHENSIVE METABOLIC PANEL
ALT: 10 U/L (ref 0–44)
AST: 10 U/L — ABNORMAL LOW (ref 15–41)
Albumin: 3.7 g/dL (ref 3.5–5.0)
Alkaline Phosphatase: 58 U/L (ref 38–126)
Anion gap: 8 (ref 5–15)
BUN: 7 mg/dL (ref 6–20)
CO2: 25 mmol/L (ref 22–32)
Calcium: 8.6 mg/dL — ABNORMAL LOW (ref 8.9–10.3)
Chloride: 104 mmol/L (ref 98–111)
Creatinine, Ser: 0.88 mg/dL (ref 0.44–1.00)
GFR calc Af Amer: >60 mL/min (ref >60)
GFR calc non Af Amer: >60 mL/min (ref >60)
Glucose, Bld: 107 mg/dL — ABNORMAL HIGH (ref 70–99)
Potassium: 4 mmol/L (ref 3.5–5.1)
Sodium: 137 mmol/L (ref 135–145)
Total Bilirubin: 0.4 mg/dL (ref 0.3–1.2)
Total Protein: 7.3 g/dL (ref 6.5–8.1)

## 2020-02-06 LAB — LIPASE, BLOOD: Lipase: 53 U/L — ABNORMAL HIGH (ref 11–51)

## 2020-02-06 MED ORDER — ONDANSETRON 4 MG PO TBDP
4.0000 mg | ORAL_TABLET | Freq: Three times a day (TID) | ORAL | 0 refills | Status: AC | PRN
Start: 2020-02-06 — End: 2020-02-09

## 2020-02-06 MED ORDER — KETOROLAC TROMETHAMINE 15 MG/ML IJ SOLN
15.0000 mg | Freq: Once | INTRAMUSCULAR | Status: AC
  Administered 2020-02-06: 15 mg via INTRAVENOUS
  Filled 2020-02-06: qty 1

## 2020-02-08 ENCOUNTER — Encounter: Payer: Self-pay | Admitting: Nurse Practitioner

## 2020-02-08 ENCOUNTER — Ambulatory Visit (INDEPENDENT_AMBULATORY_CARE_PROVIDER_SITE_OTHER): Payer: 59 | Admitting: Nurse Practitioner

## 2020-02-08 ENCOUNTER — Ambulatory Visit (HOSPITAL_COMMUNITY)
Admission: RE | Admit: 2020-02-08 | Discharge: 2020-02-08 | Disposition: A | Discharge status: Home / Self Care | Payer: 59 | Source: Ambulatory Visit | Attending: Nurse Practitioner | Admitting: Nurse Practitioner

## 2020-02-08 ENCOUNTER — Other Ambulatory Visit: Payer: Self-pay

## 2020-02-08 VITALS — BP 144/94 | HR 94 | Temp 97.9°F | Ht 64.0 in | Wt 204.0 lb

## 2020-02-08 DIAGNOSIS — R8290 Unspecified abnormal findings in urine: Secondary | ICD-10-CM | POA: Diagnosis not present

## 2020-02-08 DIAGNOSIS — I10 Essential (primary) hypertension: Secondary | ICD-10-CM

## 2020-02-08 DIAGNOSIS — R1011 Right upper quadrant pain: Secondary | ICD-10-CM

## 2020-02-08 DIAGNOSIS — R824 Acetonuria: Secondary | ICD-10-CM

## 2020-02-08 LAB — CBC WITH DIFFERENTIAL/PLATELET
Basophils Absolute: 0 10*3/uL (ref 0.0–0.2)
Basos: 0 %
EOS (ABSOLUTE): 0.1 10*3/uL (ref 0.0–0.4)
Eos: 2 %
Hematocrit: 39.4 % (ref 34.0–46.6)
Hemoglobin: 13.1 g/dL (ref 11.1–15.9)
Lymphocytes Absolute: 2.4 10*3/uL (ref 0.7–3.1)
Lymphs: 32 %
MCH: 29.1 pg (ref 26.6–33.0)
MCHC: 33.2 g/dL (ref 31.5–35.7)
MCV: 88 fL (ref 79–97)
Monocytes Absolute: 0.5 10*3/uL (ref 0.1–0.9)
Monocytes: 6 %
Neutrophils Absolute: 4.3 10*3/uL (ref 1.4–7.0)
Neutrophils: 60 %
Platelets: 325 10*3/uL (ref 150–450)
RBC: 4.5 x10E6/uL (ref 3.77–5.28)
RDW: 15.3 % (ref 11.7–15.4)
WBC: 7.3 10*3/uL (ref 3.4–10.8)

## 2020-02-08 LAB — POCT URINALYSIS DIPSTICK
Blood, UA: NEGATIVE
Glucose, UA: NEGATIVE
Leukocytes, UA: NEGATIVE
Nitrite, UA: NEGATIVE
Protein, UA: POSITIVE — AB
Spec Grav, UA: >=1.03 — AB (ref 1.010–1.025)
Urobilinogen, UA: 1 E.U./dL
pH, UA: 6 (ref 5.0–8.0)

## 2020-02-08 LAB — GLUCOSE, POCT (MANUAL RESULT ENTRY): POC Glucose: 86 mg/dl (ref 70–99)

## 2020-02-08 LAB — POCT GLYCOSYLATED HEMOGLOBIN (HGB A1C)
HbA1c POC (<> result, manual entry): 5.3 % (ref 4.0–5.6)
HbA1c, POC (controlled diabetic range): 5.3 % (ref 0.0–7.0)
HbA1c, POC (prediabetic range): 5.3 % — AB (ref 5.7–6.4)
Hemoglobin A1C: 5.3 % (ref 4.0–5.6)

## 2020-02-08 NOTE — Progress Notes (Signed)
[employer] Patient [facility] [address] [place], [place]  [address] Phone:  [phone]   Fax:  [phone]   Established Patient Office Visit  Subjective:  Patient ID: Victoria Carpenter, female    DOB: [birth year]  Age: 44 y.o. MRN: [id number]  CC:  Chief Complaint  Patient presents with  . Abdominal Pain    ED 6/7; Friday felt like reflux started getting worse comes and goes;pain starts in the middle and goes into right side.    HPI Victoria Carpenter presents for abdominal pain. She  has a past medical history of Hypertension, Vaginal delivery, and Vaginitis due to Gardnerella vaginalis (2010).   Abdominal Pain Patient complains of abdominal pain. The pain is described as cramping, and is 10/10 in intensity. The patient is experiencing epigastric and RUQ pain with radiation to chest. Onset was 6 days ago. Symptoms have been been intermittent. Aggravating factors: movement.  Alleviating factors: none. Associated symptoms: nausea x 1 on Monday and back pain. The patient denies anorexia, arthralagias, belching, chills, diarrhea, dysuria, fever, frequency, headache, hematochezia, hematuria, melena, sweats and vomiting. She was seen in the ER on 02/05/2020 abdominal work up was negative overall; lipase was slightly elevated. UA did have 15mg  ketones.    Past Medical History:  Diagnosis Date  . Hypertension   . Vaginal delivery    x 2  . Vaginitis due to Gardnerella vaginalis 2010    No past surgical history on file.  Family History  Problem Relation Age of Onset  . Hypertension Mother   . Hypertension Father     Social History   Socioeconomic History  . Marital status: Single    Spouse name: Not on file  . Number of children: Not on file  . Years of education: Not on file  . Highest education level: Not on file  Occupational History  . Not on file  Tobacco Use  . Smoking status: Current Every Day Smoker    Packs/day: 0.25    Types: Cigarettes  . Smokeless  tobacco: Never Used  Vaping Use  . Vaping Use: Never used  Substance and Sexual Activity  . Alcohol use: No  . Drug use: No  . Sexual activity: Yes    Birth control/protection: Condom  Other Topics Concern  . Not on file  Social History Narrative   Home Health, private sitter   Social Determinants of Health   Financial Resource Strain:   . Difficulty of Paying Living Expenses:   Food Insecurity:   . Worried About 2011 in the Last Year:   . Programme researcher, broadcasting/film/video in the Last Year:   Transportation Needs:   . Barista (Medical):   Freight forwarder Lack of Transportation (Non-Medical):   Physical Activity:   . Days of Exercise per Week:   . Minutes of Exercise per Session:   Stress:   . Feeling of Stress :   Social Connections:   . Frequency of Communication with Friends and Family:   . Frequency of Social Gatherings with Friends and Family:   . Attends Religious Services:   . Active Member of Clubs or Organizations:   . Attends Marland Kitchen Meetings:   Banker Marital Status:   Intimate Partner Violence:   . Fear of Current or Ex-Partner:   . Emotionally Abused:   Marland Kitchen Physically Abused:   . Sexually Abused:     Outpatient Medications Prior to Visit  Medication Sig Dispense Refill  .  amLODipine (NORVASC) 5 MG tablet Take 1 tablet (5 mg total) by mouth daily. 90 tablet 3  . hydrochlorothiazide (HYDRODIURIL) 25 MG tablet Take 1 tablet (25 mg total) by mouth daily. 30 tablet 2  . ibuprofen (ADVIL,MOTRIN) 200 MG tablet Take 200 mg by mouth every 6 (six) hours as needed.    . nicotine (NICODERM CQ) 21 mg/24hr patch Place 1 patch (21 mg total) onto the skin daily. 30 patch 1  . ondansetron (ZOFRAN ODT) 4 MG disintegrating tablet Take 1 tablet (4 mg total) by mouth every 8 (eight) hours as needed for up to 3 days for nausea or vomiting. (Patient not taking: Reported on 02/08/2020) 15 tablet 0   No facility-administered medications prior to visit.    No Known  Allergies  ROS Review of Systems    Objective:    Physical Exam Constitutional:      General: She is in acute distress.     Appearance: She is not toxic-appearing.  HENT:     Head: Normocephalic.  Cardiovascular:     Rate and Rhythm: Normal rate and regular rhythm.     Heart sounds: Normal heart sounds.  Pulmonary:     Effort: Pulmonary effort is normal.     Breath sounds: Normal breath sounds.  Abdominal:     General: There is distension.     Palpations: There is mass.     Tenderness: There is abdominal tenderness in the right upper quadrant and epigastric area. There is guarding. There is no right CVA tenderness, left CVA tenderness or rebound.  Skin:    General: Skin is warm and dry.  Neurological:     Mental Status: She is alert and oriented to person, place, and time.  Psychiatric:        Mood and Affect: Mood normal.        Behavior: Behavior normal.     BP (!) 144/94 (BP Location: Right Arm, Patient Position: Sitting, Cuff Size: Large)   Pulse 94   Temp 97.9 F (36.6 C)   Ht 5\' 4"  (1.626 m)   Wt 204 lb 0.6 oz (92.6 kg)   LMP 01/29/2020   SpO2 100%   BMI 35.02 kg/m  Wt Readings from Last 3 Encounters:  02/08/20 204 lb 0.6 oz (92.6 kg)  12/29/19 208 lb 6.4 oz (94.5 kg)  06/02/19 210 lb 6.4 oz (95.4 kg)     Health Maintenance Due  Topic Date Due  . Hepatitis C Screening  Never done  . COVID-19 Vaccine (1) Never done    There are no preventive care reminders to display for this patient.  Lab Results  Component Value Date   TSH 1.440 12/29/2019   Lab Results  Component Value Date   WBC 7.3 02/08/2020   HGB 13.1 02/08/2020   HCT 39.4 02/08/2020   MCV 88 02/08/2020   PLT 325 02/08/2020   Lab Results  Component Value Date   NA 137 02/06/2020   K 4.0 02/06/2020   CO2 25 02/06/2020   GLUCOSE 107 (H) 02/06/2020   BUN 7 02/06/2020   CREATININE 0.88 02/06/2020   BILITOT 0.4 02/06/2020   ALKPHOS 58 02/06/2020   AST 10 (L) 02/06/2020   ALT 10  02/06/2020   PROT 7.3 02/06/2020   ALBUMIN 3.7 02/06/2020   CALCIUM 8.6 (L) 02/06/2020   ANIONGAP 8 02/06/2020   Lab Results  Component Value Date   CHOL 172 12/29/2019   Lab Results  Component Value Date   HDL  38 (L) 12/29/2019   Lab Results  Component Value Date   LDLCALC 119 (H) 12/29/2019   Lab Results  Component Value Date   TRIG 81 12/29/2019   Lab Results  Component Value Date   CHOLHDL 4.5 (H) 12/29/2019   Lab Results  Component Value Date   HGBA1C 5.3 02/08/2020   HGBA1C 5.3 02/08/2020   HGBA1C 5.3 (A) 02/08/2020   HGBA1C 5.3 02/08/2020      Assessment & Plan:   Problem List Items Addressed This Visit      Cardiovascular and Mediastinum   Essential hypertension   Relevant Orders   Urinalysis Dipstick (Completed)    Other Visit Diagnoses    Right upper quadrant abdominal pain    -  Primary   Relevant Orders   H. pylori breath test   DG Abd 2 Views (Completed)   CBC with Differential/Platelet (Completed)   Comp. Metabolic Panel (12)   Amylase   Urine ketones       Relevant Orders   Comp. Metabolic Panel (12)   Glucose (CBG) (Completed)   HgB A1c (Completed)   Abnormal urinalysis          No orders of the defined types were placed in this encounter.   Follow-up: No follow-ups on file.    Barbette Merino, NP

## 2020-02-10 ENCOUNTER — Other Ambulatory Visit: Payer: Self-pay

## 2020-02-10 ENCOUNTER — Encounter (HOSPITAL_COMMUNITY): Payer: Self-pay | Admitting: *Deleted

## 2020-02-10 ENCOUNTER — Emergency Department (HOSPITAL_COMMUNITY)
Admission: EM | Admit: 2020-02-10 | Discharge: 2020-02-11 | Disposition: A | Discharge status: Home / Self Care | Payer: 59 | Attending: Emergency Medicine | Admitting: Emergency Medicine

## 2020-02-10 DIAGNOSIS — I10 Essential (primary) hypertension: Secondary | ICD-10-CM | POA: Insufficient documentation

## 2020-02-10 DIAGNOSIS — R1011 Right upper quadrant pain: Secondary | ICD-10-CM | POA: Diagnosis present

## 2020-02-10 DIAGNOSIS — K8500 Idiopathic acute pancreatitis without necrosis or infection: Secondary | ICD-10-CM | POA: Diagnosis not present

## 2020-02-10 DIAGNOSIS — Z79899 Other long term (current) drug therapy: Secondary | ICD-10-CM | POA: Diagnosis not present

## 2020-02-10 DIAGNOSIS — F17210 Nicotine dependence, cigarettes, uncomplicated: Secondary | ICD-10-CM | POA: Insufficient documentation

## 2020-02-10 LAB — COMPREHENSIVE METABOLIC PANEL
ALT: 10 U/L (ref 0–44)
AST: 12 U/L — ABNORMAL LOW (ref 15–41)
Albumin: 3.7 g/dL (ref 3.5–5.0)
Alkaline Phosphatase: 58 U/L (ref 38–126)
Anion gap: 9 (ref 5–15)
BUN: 10 mg/dL (ref 6–20)
CO2: 25 mmol/L (ref 22–32)
Calcium: 9.1 mg/dL (ref 8.9–10.3)
Chloride: 103 mmol/L (ref 98–111)
Creatinine, Ser: 0.88 mg/dL (ref 0.44–1.00)
GFR calc Af Amer: >60 mL/min (ref >60)
GFR calc non Af Amer: >60 mL/min (ref >60)
Glucose, Bld: 106 mg/dL — ABNORMAL HIGH (ref 70–99)
Potassium: 3.9 mmol/L (ref 3.5–5.1)
Sodium: 137 mmol/L (ref 135–145)
Total Bilirubin: 0.7 mg/dL (ref 0.3–1.2)
Total Protein: 7.6 g/dL (ref 6.5–8.1)

## 2020-02-10 LAB — URINALYSIS, ROUTINE W REFLEX MICROSCOPIC
Bilirubin Urine: NEGATIVE
Glucose, UA: NEGATIVE mg/dL
Hgb urine dipstick: NEGATIVE
Ketones, ur: 5 mg/dL — AB
Leukocytes,Ua: NEGATIVE
Nitrite: NEGATIVE
Protein, ur: NEGATIVE mg/dL
Specific Gravity, Urine: 1.021 (ref 1.005–1.030)
pH: 7 (ref 5.0–8.0)

## 2020-02-10 LAB — LIPASE, BLOOD: Lipase: 63 U/L — ABNORMAL HIGH (ref 11–51)

## 2020-02-10 LAB — I-STAT BETA HCG BLOOD, ED (MC, WL, AP ONLY): I-stat hCG, quantitative: <5 m[IU]/mL (ref <5)

## 2020-02-10 MED ORDER — SODIUM CHLORIDE 0.9% FLUSH
3.0000 mL | Freq: Once | INTRAVENOUS | Status: AC
  Administered 2020-02-11: 3 mL via INTRAVENOUS

## 2020-02-10 NOTE — ED Triage Notes (Signed)
Pt says she has been having abdominal pain since last week, seen in the ED since, no relief. No having nausea, and chills in addition to abdominal pain.

## 2020-02-11 ENCOUNTER — Emergency Department (HOSPITAL_COMMUNITY): Payer: 59

## 2020-02-11 ENCOUNTER — Encounter (HOSPITAL_COMMUNITY): Payer: Self-pay

## 2020-02-11 LAB — CBC
HCT: 40.2 % (ref 36.0–46.0)
Hemoglobin: 13.5 g/dL (ref 12.0–15.0)
MCH: 29.9 pg (ref 26.0–34.0)
MCHC: 33.6 g/dL (ref 30.0–36.0)
MCV: 89.1 fL (ref 80.0–100.0)
Platelets: 323 10*3/uL (ref 150–400)
RBC: 4.51 MIL/uL (ref 3.87–5.11)
RDW: 14.5 % (ref 11.5–15.5)
WBC: 8.1 10*3/uL (ref 4.0–10.5)
nRBC: 0 % (ref 0.0–0.2)

## 2020-02-11 MED ORDER — ONDANSETRON HCL 4 MG/2ML IJ SOLN
4.0000 mg | Freq: Once | INTRAMUSCULAR | Status: AC
  Administered 2020-02-11: 4 mg via INTRAVENOUS
  Filled 2020-02-11: qty 2

## 2020-02-11 MED ORDER — SODIUM CHLORIDE (PF) 0.9 % IJ SOLN
INTRAMUSCULAR | Status: AC
  Filled 2020-02-11: qty 50

## 2020-02-11 MED ORDER — ONDANSETRON 8 MG PO TBDP: 8.0000 mg | ORAL_TABLET | Freq: Three times a day (TID) | ORAL | 0 refills | Status: DC | PRN

## 2020-02-11 MED ORDER — IOHEXOL 300 MG/ML  SOLN
100.0000 mL | Freq: Once | INTRAMUSCULAR | Status: AC | PRN
  Administered 2020-02-11: 100 mL via INTRAVENOUS

## 2020-02-11 MED ORDER — PANTOPRAZOLE SODIUM 40 MG IV SOLR
40.0000 mg | Freq: Once | INTRAVENOUS | Status: AC
  Administered 2020-02-11: 40 mg via INTRAVENOUS
  Filled 2020-02-11: qty 40

## 2020-02-11 MED ORDER — SODIUM CHLORIDE 0.9 % IV BOLUS
1000.0000 mL | Freq: Once | INTRAVENOUS | Status: AC
  Administered 2020-02-11: 1000 mL via INTRAVENOUS

## 2020-02-11 MED ORDER — OXYCODONE HCL 5 MG PO TABS: 5.0000 mg | ORAL_TABLET | ORAL | 0 refills | Status: DC | PRN

## 2020-02-11 MED ORDER — FENTANYL CITRATE (PF) 100 MCG/2ML IJ SOLN
100.0000 ug | Freq: Once | INTRAMUSCULAR | Status: AC
  Administered 2020-02-11: 100 ug via INTRAVENOUS
  Filled 2020-02-11: qty 2

## 2020-02-11 NOTE — ED Provider Notes (Signed)
[place] DEPT Provider Note: Georgena Spurling, MD, FACEP  CSN: [id number] MRN: [id number] ARRIVAL: 02/10/20 at [place]: [place]  Abdominal Pain   HISTORY OF PRESENT ILLNESS  02/11/20 1:24 AM Victoria Carpenter is a 44 y.o. female with about 9 days of upper abdominal pain.  It has been more prominent in the right upper quadrant but also extends to the left upper quadrant.  The pain has been coming and going.  She rates it currently as a 5 out of 10 but it has been worse at times.  It is worse with palpation or movement.  She was seen in the ED 02/05/2020 by Dr. Leonette Monarch who performed a right upper quadrant ultrasound which was unremarkable.  He advised her to take Tums and/or Pepto-Bismol which she has been doing without relief.  3 days ago she saw a nurse practitioner who ordered abdominal films which showed prominent stool in the cecum as well as gas throughout the remainder of the colon but no obstruction or free air.  She returns because the pain is persistent and she has had nausea and vomiting throughout the day yesterday.   Past Medical History:  Diagnosis Date  . Hypertension   . Vaginal delivery    x 2  . Vaginitis due to Gardnerella vaginalis 2010    History reviewed. No pertinent surgical history.  Family History  Problem Relation Age of Onset  . Hypertension Mother   . Hypertension Father     Social History   Tobacco Use  . Smoking status: Current Every Day Smoker    Packs/day: 0.25    Types: Cigarettes  . Smokeless tobacco: Never Used  Vaping Use  . Vaping Use: Never used  Substance Use Topics  . Alcohol use: No  . Drug use: No    Prior to Admission medications   Medication Sig Start Date End Date Taking? Authorizing Provider  amLODipine (NORVASC) 5 MG tablet Take 1 tablet (5 mg total) by mouth daily. 12/29/19   Vevelyn Francois, NP  hydrochlorothiazide (HYDRODIURIL) 25 MG tablet Take 1 tablet (25 mg total) by mouth daily. 12/29/19    Vevelyn Francois, NP  nicotine (NICODERM CQ) 21 mg/24hr patch Place 1 patch (21 mg total) onto the skin daily. 12/29/19 02/27/20  Vevelyn Francois, NP  ondansetron (ZOFRAN ODT) 8 MG disintegrating tablet Take 1 tablet (8 mg total) by mouth every 8 (eight) hours as needed for nausea or vomiting. 02/11/20   Feleica Fulmore, Jenny Reichmann, MD  oxyCODONE (OXY IR/ROXICODONE) 5 MG immediate release tablet Take 1 tablet (5 mg total) by mouth every 4 (four) hours as needed for severe pain. 02/11/20   Jonessa Triplett, Jenny Reichmann, MD    Allergies Patient has no known allergies.   REVIEW OF SYSTEMS  Negative except as noted here or in the History of Present Illness.   PHYSICAL EXAMINATION  Initial Vital Signs Blood pressure (!) 160/107, pulse 78, temperature 98.2 F (36.8 C), resp. rate 16, last menstrual period 01/29/2020, SpO2 98 %.  Examination General: Well-developed, well-nourished female in no acute distress; appearance consistent with age of record HENT: normocephalic; atraumatic Eyes: pupils equal, round and reactive to light; extraocular muscles intact Neck: supple Heart: regular rate and rhythm Lungs: clear to auscultation bilaterally Abdomen: soft; nondistended; upper abdominal tenderness; bowel sounds present Extremities: No deformity; full range of motion; pulses normal Neurologic: Awake, alert and oriented; motor function intact in all extremities and symmetric; no facial droop Skin: Warm and dry Psychiatric:  Normal mood and affect   RESULTS  Summary of this visit's results, reviewed and interpreted by myself:   EKG Interpretation  Date/Time:    Ventricular Rate:    PR Interval:    QRS Duration:   QT Interval:    QTC Calculation:   R Axis:     Text Interpretation:        Laboratory Studies: Results for orders placed or performed during the hospital encounter of 02/10/20 (from the past 24 hour(s))  Lipase, blood     Status: Abnormal   Collection Time: 02/10/20 11:02 PM  Result Value Ref Range    Lipase 63 (H) 11 - 51 U/L  Comprehensive metabolic panel     Status: Abnormal   Collection Time: 02/10/20 11:02 PM  Result Value Ref Range   Sodium 137 135 - 145 mmol/L   Potassium 3.9 3.5 - 5.1 mmol/L   Chloride 103 98 - 111 mmol/L   CO2 25 22 - 32 mmol/L   Glucose, Bld 106 (H) 70 - 99 mg/dL   BUN 10 6 - 20 mg/dL   Creatinine, Ser 7.10 0.44 - 1.00 mg/dL   Calcium 9.1 8.9 - 62.6 mg/dL   Total Protein 7.6 6.5 - 8.1 g/dL   Albumin 3.7 3.5 - 5.0 g/dL   AST 12 (L) 15 - 41 U/L   ALT 10 0 - 44 U/L   Alkaline Phosphatase 58 38 - 126 U/L   Total Bilirubin 0.7 0.3 - 1.2 mg/dL   GFR calc non Af Amer >60 >60 mL/min   GFR calc Af Amer >60 >60 mL/min   Anion gap 9 5 - 15  CBC     Status: None   Collection Time: 02/10/20 11:02 PM  Result Value Ref Range   WBC 8.1 4.0 - 10.5 K/uL   RBC 4.51 3.87 - 5.11 MIL/uL   Hemoglobin 13.5 12.0 - 15.0 g/dL   HCT 94.8 36 - 46 %   MCV 89.1 80.0 - 100.0 fL   MCH 29.9 26.0 - 34.0 pg   MCHC 33.6 30.0 - 36.0 g/dL   RDW 54.6 27.0 - 35.0 %   Platelets 323 150 - 400 K/uL   nRBC 0.0 0.0 - 0.2 %  Urinalysis, Routine w reflex microscopic     Status: Abnormal   Collection Time: 02/10/20 11:16 PM  Result Value Ref Range   Color, Urine YELLOW YELLOW   APPearance CLEAR CLEAR   Specific Gravity, Urine 1.021 1.005 - 1.030   pH 7.0 5.0 - 8.0   Glucose, UA NEGATIVE NEGATIVE mg/dL   Hgb urine dipstick NEGATIVE NEGATIVE   Bilirubin Urine NEGATIVE NEGATIVE   Ketones, ur 5 (A) NEGATIVE mg/dL   Protein, ur NEGATIVE NEGATIVE mg/dL   Nitrite NEGATIVE NEGATIVE   Leukocytes,Ua NEGATIVE NEGATIVE  I-Stat beta hCG blood, ED     Status: None   Collection Time: 02/10/20 11:23 PM  Result Value Ref Range   I-stat hCG, quantitative <5.0 <5 mIU/mL   Comment 3           Imaging Studies: CT ABDOMEN PELVIS W CONTRAST  Result Date: 02/11/2020 CLINICAL DATA:  Abdominal pain EXAM: CT ABDOMEN AND PELVIS WITH CONTRAST TECHNIQUE: Multidetector CT imaging of the abdomen and pelvis was  performed using the standard protocol following bolus administration of intravenous contrast. CONTRAST:  OMNIPAQUE IOHEXOL 300 MG/ML  SOLN COMPARISON:  None. FINDINGS: Lower chest: The lung bases are clear. The heart size is normal. Hepatobiliary: The liver is normal.  Normal gallbladder.There is no biliary ductal dilation. Pancreas: There is mild fat stranding about the pancreas. Spleen: Unremarkable. Adrenals/Urinary Tract: --Adrenal glands: Unremarkable. --Right kidney/ureter: No hydronephrosis or radiopaque kidney stones. --Left kidney/ureter: No hydronephrosis or radiopaque kidney stones. --Urinary bladder: Unremarkable. Stomach/Bowel: --Stomach/Duodenum: No hiatal hernia or other gastric abnormality. Normal duodenal course and caliber. --Small bowel: Unremarkable. --Colon: Unremarkable. --Appendix: Normal. Vascular/Lymphatic: Atherosclerotic calcification is present within the non-aneurysmal abdominal aorta, without hemodynamically significant stenosis. --No retroperitoneal lymphadenopathy. --No mesenteric lymphadenopathy. --No pelvic or inguinal lymphadenopathy. Reproductive: There is a fibroid uterus with multiple fibroids of varying sizes. Other: There is a small volume of pelvic free fluid which is likely physiologic. No free air. The abdominal wall is normal. Musculoskeletal. No acute displaced fractures. IMPRESSION: 1. There is mild fat stranding about the pancreas, suggestive of acute pancreatitis. Correlation with lipase is recommended. 2. Fibroid uterus. 3. Small volume of pelvic free fluid is likely physiologic. Aortic Atherosclerosis (ICD10-I70.0). Electronically Signed   By: Katherine Mantle M.D.   On: 02/11/2020 02:25    ED COURSE and MDM  Nursing notes, initial and subsequent vitals signs, including pulse oximetry, reviewed and interpreted by myself.  Vitals:   02/10/20 2258  BP: (!) 160/107  Pulse: 78  Resp: 16  Temp: 98.2 F (36.8 C)  SpO2: 98%   Medications  sodium  chloride flush (NS) 0.9 % injection 3 mL (3 mLs Intravenous Given 02/11/20 0019)  ondansetron (ZOFRAN) injection 4 mg (4 mg Intravenous Given 02/11/20 0145)  fentaNYL (SUBLIMAZE) injection 100 mcg (100 mcg Intravenous Given 02/11/20 0144)  sodium chloride 0.9 % bolus 1,000 mL (1,000 mLs Intravenous New Bag/Given 02/11/20 0144)  pantoprazole (PROTONIX) injection 40 mg (40 mg Intravenous Given 02/11/20 0144)  iohexol (OMNIPAQUE) 300 MG/ML solution 100 mL (100 mLs Intravenous Contrast Given 02/11/20 0157)   2:56 AM Patient advised of CT findings and slightly elevated lipase consistent with mild pancreatitis.  The patient is not a drinker and there was no evidence of ductal stone on CT.  I do not believe admission is indicated at this time but she will need to follow-up with her nurse practitioner.  She was advised to have her triglycerides tested as hypertriglyceridemia can lead to pancreatitis.   PROCEDURES  Procedures   ED DIAGNOSES     ICD-10-CM   1. Idiopathic acute pancreatitis without infection or necrosis  K85.00        Suheyb Raucci, MD 02/11/20 0300

## 2020-02-15 ENCOUNTER — Ambulatory Visit (INDEPENDENT_AMBULATORY_CARE_PROVIDER_SITE_OTHER): Payer: 59 | Admitting: Nurse Practitioner

## 2020-02-15 ENCOUNTER — Other Ambulatory Visit: Payer: Self-pay

## 2020-02-15 VITALS — BP 140/90 | HR 77 | Temp 97.7°F | Wt 203.0 lb

## 2020-02-15 DIAGNOSIS — F17200 Nicotine dependence, unspecified, uncomplicated: Secondary | ICD-10-CM

## 2020-02-15 DIAGNOSIS — R748 Abnormal levels of other serum enzymes: Secondary | ICD-10-CM

## 2020-02-15 DIAGNOSIS — I10 Essential (primary) hypertension: Secondary | ICD-10-CM | POA: Diagnosis not present

## 2020-02-16 ENCOUNTER — Other Ambulatory Visit: Payer: 59

## 2020-02-16 DIAGNOSIS — I10 Essential (primary) hypertension: Secondary | ICD-10-CM

## 2020-02-16 DIAGNOSIS — R748 Abnormal levels of other serum enzymes: Secondary | ICD-10-CM

## 2020-02-16 NOTE — Progress Notes (Signed)
Victoria Carpenter, [place]  [address] Phone:  [phone]   Fax:  [phone]   Established Patient Office Visit  Subjective:  Patient ID: Victoria Carpenter, female    DOB: 27-Feb-1976  Age: 44 y.o. MRN: [id number]  CC:  Chief Complaint  Patient presents with   Acute Visit    ED 6/12 mild pancreatitis requesting blood draw for triglyceride per ED order     HPI Victoria Carpenter presents for follow up. She  has a past medical history of Hypertension, Vaginal delivery, and Vaginitis due to Gardnerella vaginalis (2010).   She was having increased abdominal pain greater on the right.  She was diagnosed with mild pancreatitis.  She denies alcohol use.  She admits that her symptoms have resolved.  She still suffers from some constipation.  She has been on a liquid diet for the last 4 her last bowel movement was the day after she completed the magnesium citrate for the moderate stool burden noted via x-ray. Denies headache, dizziness, visual changes, shortness of breath, dyspnea on exertion, chest pain, nausea, vomiting or any edema. She was encouraged to have her triglycerides level evaluated.     Past Medical History:  Diagnosis Date   Hypertension    Vaginal delivery    x 2   Vaginitis due to Gardnerella vaginalis 2010    No past surgical history on file.  Family History  Problem Relation Age of Onset   Hypertension Mother    Hypertension Father     Social History   Socioeconomic History   Marital status: Single    Spouse name: Not on file   Number of children: Not on file   Years of education: Not on file   Highest education level: Not on file  Occupational History   Not on file  Tobacco Use   Smoking status: Current Every Day Smoker    Packs/day: 0.25    Types: Cigarettes   Smokeless tobacco: Never Used  Vaping Use   Vaping Use: Never used  Substance and Sexual Activity   Alcohol use: No   Drug use: No     Sexual activity: Yes    Birth control/protection: Condom  Other Topics Concern   Not on file  Social History Narrative   Home Health, private sitter   Social Determinants of Health   Financial Resource Strain:    Difficulty of Paying Living Expenses:   Food Insecurity:    Worried About Charity fundraiser in the Last Year:    Arboriculturist in the Last Year:   Transportation Needs:    Film/video editor (Medical):    Lack of Transportation (Non-Medical):   Physical Activity:    Days of Exercise per Week:    Minutes of Exercise per Session:   Stress:    Feeling of Stress :   Social Connections:    Frequency of Communication with Friends and Family:    Frequency of Social Gatherings with Friends and Family:    Attends Religious Services:    Active Member of Clubs or Organizations:    Attends Music therapist:    Marital Status:   Intimate Partner Violence:    Fear of Current or Ex-Partner:    Emotionally Abused:    Physically Abused:    Sexually Abused:     Outpatient Medications Prior to Visit  Medication Sig Dispense Refill   amLODipine (NORVASC) 5 MG tablet Take  1 tablet (5 mg total) by mouth daily. 90 tablet 3   hydrochlorothiazide (HYDRODIURIL) 25 MG tablet Take 1 tablet (25 mg total) by mouth daily. 30 tablet 2   nicotine (NICODERM CQ) 21 mg/24hr patch Place 1 patch (21 mg total) onto the skin daily. 30 patch 1   ondansetron (ZOFRAN ODT) 8 MG disintegrating tablet Take 1 tablet (8 mg total) by mouth every 8 (eight) hours as needed for nausea or vomiting. 20 tablet 0   oxyCODONE (OXY IR/ROXICODONE) 5 MG immediate release tablet Take 1 tablet (5 mg total) by mouth every 4 (four) hours as needed for severe pain. 20 tablet 0   No facility-administered medications prior to visit.    No Known Allergies  ROS Review of Systems    Objective:    Physical Exam Constitutional:      General: She is not in acute distress.     Appearance: She is obese. She is not ill-appearing, toxic-appearing or diaphoretic.  HENT:     Head: Normocephalic and atraumatic.     Nose: Nose normal.     Mouth/Throat:     Mouth: Mucous membranes are moist.  Cardiovascular:     Rate and Rhythm: Normal rate and regular rhythm.     Pulses: Normal pulses.     Heart sounds: Normal heart sounds.  Pulmonary:     Effort: Pulmonary effort is normal.     Breath sounds: Normal breath sounds.  Abdominal:     General: Bowel sounds are normal.     Palpations: Abdomen is soft.  Musculoskeletal:        General: Normal range of motion.     Cervical back: Normal range of motion.  Skin:    General: Skin is warm and dry.     Capillary Refill: Capillary refill takes less than 2 seconds.  Neurological:     Mental Status: She is alert and oriented to person, place, and time.  Psychiatric:        Mood and Affect: Mood normal.        Behavior: Behavior normal.        Thought Content: Thought content normal.        Judgment: Judgment normal.     BP 140/90 (BP Location: Left Arm, Patient Position: Sitting, Cuff Size: Small)    Pulse 77    Temp 97.7 F (36.5 C)    Wt 203 lb (92.1 kg)    LMP 01/29/2020 Comment: neg hcg on 02/10/2020   SpO2 100%    BMI 34.84 kg/m  Wt Readings from Last 3 Encounters:  02/15/20 203 lb (92.1 kg)  02/08/20 204 lb 0.6 oz (92.6 kg)  12/29/19 208 lb 6.4 oz (94.5 kg)     Health Maintenance Due  Topic Date Due   Hepatitis C Screening  Never done   COVID-19 Vaccine (1) Never done    There are no preventive care reminders to display for this patient.  Lab Results  Component Value Date   TSH 1.440 12/29/2019   Lab Results  Component Value Date   WBC 8.1 02/10/2020   HGB 13.5 02/10/2020   HCT 40.2 02/10/2020   MCV 89.1 02/10/2020   PLT 323 02/10/2020   Lab Results  Component Value Date   NA 137 02/10/2020   K 3.9 02/10/2020   CO2 25 02/10/2020   GLUCOSE 106 (H) 02/10/2020   BUN 10 02/10/2020    CREATININE 0.88 02/10/2020   BILITOT 0.7 02/10/2020   ALKPHOS 58 02/10/2020  AST 12 (L) 02/10/2020   ALT 10 02/10/2020   PROT 7.6 02/10/2020   ALBUMIN 3.7 02/10/2020   CALCIUM 9.1 02/10/2020   ANIONGAP 9 02/10/2020   Lab Results  Component Value Date   CHOL 172 12/29/2019   Lab Results  Component Value Date   HDL 38 (L) 12/29/2019   Lab Results  Component Value Date   LDLCALC 119 (H) 12/29/2019   Lab Results  Component Value Date   TRIG 81 12/29/2019   Lab Results  Component Value Date   CHOLHDL 4.5 (H) 12/29/2019   Lab Results  Component Value Date   HGBA1C 5.3 02/08/2020   HGBA1C 5.3 02/08/2020   HGBA1C 5.3 (A) 02/08/2020   HGBA1C 5.3 02/08/2020      Assessment & Plan:   Problem List Items Addressed This Visit      Cardiovascular and Mediastinum   Essential hypertension - Primary   Relevant Orders   Comp. Metabolic Panel (12) Continue with current regimen Encourage smoking cessation     Other   Tobacco dependence  patient encouraged to continue with smoking cessation.  Verbalized she will try Previous nicotine none prescription to be started    Other Visit Diagnoses    Elevated amylase and lipase       Relevant Orders   Lipid panel   Amylase   Lipase  Labs pending discussed possible causes of pancreatitis    No orders of the defined types were placed in this encounter.   Follow-up: No follow-ups on file.    Barbette Merino, NP

## 2020-02-17 LAB — LIPID PANEL
Chol/HDL Ratio: 4.8 ratio — ABNORMAL HIGH (ref 0.0–4.4)
Cholesterol, Total: 148 mg/dL (ref 100–199)
HDL: 31 mg/dL — ABNORMAL LOW (ref >39)
LDL Chol Calc (NIH): 101 mg/dL — ABNORMAL HIGH (ref 0–99)
Triglycerides: 80 mg/dL (ref 0–149)
VLDL Cholesterol Cal: 16 mg/dL (ref 5–40)

## 2020-02-17 LAB — COMP. METABOLIC PANEL (12)
AST: 10 IU/L (ref 0–40)
Albumin/Globulin Ratio: 1.5 (ref 1.2–2.2)
Albumin: 4.1 g/dL (ref 3.8–4.8)
Alkaline Phosphatase: 70 IU/L (ref 48–121)
BUN/Creatinine Ratio: 8 — ABNORMAL LOW (ref 9–23)
BUN: 7 mg/dL (ref 6–24)
Bilirubin Total: 0.5 mg/dL (ref 0.0–1.2)
Calcium: 9.4 mg/dL (ref 8.7–10.2)
Chloride: 101 mmol/L (ref 96–106)
Creatinine, Ser: 0.91 mg/dL (ref 0.57–1.00)
GFR calc Af Amer: 88 mL/min/{1.73_m2} (ref >59)
GFR calc non Af Amer: 76 mL/min/{1.73_m2} (ref >59)
Globulin, Total: 2.8 g/dL (ref 1.5–4.5)
Glucose: 92 mg/dL (ref 65–99)
Potassium: 4.3 mmol/L (ref 3.5–5.2)
Sodium: 137 mmol/L (ref 134–144)
Total Protein: 6.9 g/dL (ref 6.0–8.5)

## 2020-02-17 LAB — LIPASE: Lipase: 40 U/L (ref 14–72)

## 2020-02-17 LAB — AMYLASE: Amylase: 35 U/L (ref 31–110)

## 2020-03-07 NOTE — Telephone Encounter (Signed)
In error

## 2020-03-18 ENCOUNTER — Ambulatory Visit (INDEPENDENT_AMBULATORY_CARE_PROVIDER_SITE_OTHER): Payer: 59 | Admitting: Nurse Practitioner

## 2020-03-18 ENCOUNTER — Encounter: Payer: Self-pay | Admitting: Nurse Practitioner

## 2020-03-18 ENCOUNTER — Ambulatory Visit: Payer: Self-pay | Admitting: Nurse Practitioner

## 2020-03-18 ENCOUNTER — Other Ambulatory Visit: Payer: Self-pay

## 2020-03-18 VITALS — BP 148/96 | HR 74 | Temp 97.5°F | Resp 16 | Ht 64.0 in | Wt 196.0 lb

## 2020-03-18 DIAGNOSIS — K5900 Constipation, unspecified: Secondary | ICD-10-CM | POA: Diagnosis not present

## 2020-03-18 DIAGNOSIS — Z1211 Encounter for screening for malignant neoplasm of colon: Secondary | ICD-10-CM | POA: Diagnosis not present

## 2020-03-18 DIAGNOSIS — I10 Essential (primary) hypertension: Secondary | ICD-10-CM | POA: Diagnosis not present

## 2020-03-18 LAB — POCT URINALYSIS DIPSTICK
Bilirubin, UA: NEGATIVE
Blood, UA: NEGATIVE
Glucose, UA: NEGATIVE
Ketones, UA: NEGATIVE
Nitrite, UA: NEGATIVE
Protein, UA: NEGATIVE
Spec Grav, UA: 1.015 (ref 1.010–1.025)
Urobilinogen, UA: 0.2 E.U./dL
pH, UA: 5.5 (ref 5.0–8.0)

## 2020-03-18 MED ORDER — HYDROCHLOROTHIAZIDE 25 MG PO TABS: 25.0000 mg | ORAL_TABLET | Freq: Every day | ORAL | 3 refills | Status: DC

## 2020-03-18 MED ORDER — AMLODIPINE BESYLATE 5 MG PO TABS: 5.0000 mg | ORAL_TABLET | Freq: Every day | ORAL | 3 refills | Status: DC

## 2020-03-18 MED FILL — HYDROCHLOROTHIAZIDE 25 MG T: 25 | 90 days supply | Qty: 90 | Fill #0

## 2020-03-18 MED FILL — AMLODIPINE BESYLATE 5 MG TA: 5 | 90 days supply | Qty: 90 | Fill #0

## 2020-03-18 NOTE — Patient Instructions (Addendum)
Managing Your Hypertension Hypertension is commonly called high blood pressure. This is when the force of your blood pressing against the walls of your arteries is too strong. Arteries are blood vessels that carry blood from your heart throughout your body. Hypertension forces the heart to work harder to pump blood, and may cause the arteries to become narrow or stiff. Having untreated or uncontrolled hypertension can cause heart attack, stroke, kidney disease, and other problems. What are blood pressure readings? A blood pressure reading consists of a higher number over a lower number. Ideally, your blood pressure should be below 120/80. The first ("top") number is called the systolic pressure. It is a measure of the pressure in your arteries as your heart beats. The second ("bottom") number is called the diastolic pressure. It is a measure of the pressure in your arteries as the heart relaxes. What does my blood pressure reading mean? Blood pressure is classified into four stages. Based on your blood pressure reading, your health care provider may use the following stages to determine what type of treatment you need, if any. Systolic pressure and diastolic pressure are measured in a unit called mm Hg. Normal  Systolic pressure: below 120.  Diastolic pressure: below 80. Elevated  Systolic pressure: 120-129.  Diastolic pressure: below 80. Hypertension stage 1  Systolic pressure: 130-139.  Diastolic pressure: 80-89. Hypertension stage 2  Systolic pressure: 140 or above.  Diastolic pressure: 90 or above. What health risks are associated with hypertension? Managing your hypertension is an important responsibility. Uncontrolled hypertension can lead to:  A heart attack.  A stroke.  A weakened blood vessel (aneurysm).  Heart failure.  Kidney damage.  Eye damage.  Metabolic syndrome.  Memory and concentration problems. What changes can I make to manage my  hypertension? Hypertension can be managed by making lifestyle changes and possibly by taking medicines. Your health care provider will help you make a plan to bring your blood pressure within a normal range. Eating and drinking   Eat a diet that is high in fiber and potassium, and low in salt (sodium), added sugar, and fat. An example eating plan is called the DASH (Dietary Approaches to Stop Hypertension) diet. To eat this way: ? Eat plenty of fresh fruits and vegetables. Try to fill half of your plate at each meal with fruits and vegetables. ? Eat whole grains, such as whole wheat pasta, brown rice, or whole grain bread. Fill about one quarter of your plate with whole grains. ? Eat low-fat diary products. ? Avoid fatty cuts of meat, processed or cured meats, and poultry with skin. Fill about one quarter of your plate with lean proteins such as fish, chicken without skin, beans, eggs, and tofu. ? Avoid premade and processed foods. These tend to be higher in sodium, added sugar, and fat.  Reduce your daily sodium intake. Most people with hypertension should eat less than 1,500 mg of sodium a day.  Limit alcohol intake to no more than 1 drink a day for nonpregnant women and 2 drinks a day for men. One drink equals 12 oz of beer, 5 oz of wine, or 1 oz of hard liquor. Lifestyle  Work with your health care provider to maintain a healthy body weight, or to lose weight. Ask what an ideal weight is for you.  Get at least 30 minutes of exercise that causes your heart to beat faster (aerobic exercise) most days of the week. Activities may include walking, swimming, or biking.  Include exercise   to strengthen your muscles (resistance exercise), such as weight lifting, as part of your weekly exercise routine. Try to do these types of exercises for 30 minutes at least 3 days a week.  Do not use any products that contain nicotine or tobacco, such as cigarettes and e-cigarettes. If you need help quitting,  ask your health care provider.  Control any long-term (chronic) conditions you have, such as high cholesterol or diabetes. Monitoring  Monitor your blood pressure at home as told by your health care provider. Your personal target blood pressure may vary depending on your medical conditions, your age, and other factors.  Have your blood pressure checked regularly, as often as told by your health care provider. Working with your health care provider  Review all the medicines you take with your health care provider because there may be side effects or interactions.  Talk with your health care provider about your diet, exercise habits, and other lifestyle factors that may be contributing to hypertension.  Visit your health care provider regularly. Your health care provider can help you create and adjust your plan for managing hypertension. Will I need medicine to control my blood pressure? Your health care provider may prescribe medicine if lifestyle changes are not enough to get your blood pressure under control, and if:  Your systolic blood pressure is 130 or higher.  Your diastolic blood pressure is 80 or higher. Take medicines only as told by your health care provider. Follow the directions carefully. Blood pressure medicines must be taken as prescribed. The medicine does not work as well when you skip doses. Skipping doses also puts you at risk for problems. Contact a health care provider if:  You think you are having a reaction to medicines you have taken.  You have repeated (recurrent) headaches.  You feel dizzy.  You have swelling in your ankles.  You have trouble with your vision. Get help right away if:  You develop a severe headache or confusion.  You have unusual weakness or numbness, or you feel faint.  You have severe pain in your chest or abdomen.  You vomit repeatedly.  You have trouble breathing. Summary  Hypertension is when the force of blood pumping  through your arteries is too strong. If this condition is not controlled, it may put you at risk for serious complications.  Your personal target blood pressure may vary depending on your medical conditions, your age, and other factors. For most people, a normal blood pressure is less than 120/80.  Hypertension is managed by lifestyle changes, medicines, or both. Lifestyle changes include weight loss, eating a healthy, low-sodium diet, exercising more, and limiting alcohol. This information is not intended to replace advice given to you by your health care provider. Make sure you discuss any questions you have with your health care provider. Document Revised: 12/09/2018 Document Reviewed: 07/15/2016 Elsevier Patient Education  2020 [employer]  COVID-19 COVID-19 is a respiratory infection that is caused by a virus called severe acute respiratory syndrome coronavirus 2 (SARS-CoV-2). The disease is also known as coronavirus disease or novel coronavirus. In some people, the virus may not cause any symptoms. In others, it may cause a serious infection. The infection can get worse quickly and can lead to complications, such as:  Pneumonia, or infection of the lungs.  Acute respiratory distress syndrome or ARDS. This is a condition in which fluid build-up in the lungs prevents the lungs from filling with air and passing oxygen into the blood.  Acute  respiratory failure. This is a condition in which there is not enough oxygen passing from the lungs to the body or when carbon dioxide is not passing from the lungs out of the body.  Sepsis or septic shock. This is a serious bodily reaction to an infection.  Blood clotting problems.  Secondary infections due to bacteria or fungus.  Organ failure. This is when your body's organs stop working. The virus that causes COVID-19 is contagious. This means that it can spread from person to person through droplets from coughs and sneezes (respiratory  secretions). What are the causes? This illness is caused by a virus. You may catch the virus by:  Breathing in droplets from an infected person. Droplets can be spread by a person breathing, speaking, singing, coughing, or sneezing.  Touching something, like a table or a doorknob, that was exposed to the virus (contaminated) and then touching your mouth, nose, or eyes. What increases the risk? Risk for infection You are more likely to be infected with this virus if you:  Are within 6 feet (2 meters) of a person with COVID-19.  Provide care for or live with a person who is infected with COVID-19.  Spend time in crowded indoor spaces or live in shared housing. Risk for serious illness You are more likely to become seriously ill from the virus if you:  Are 38 years of age or older. The higher your age, the more you are at risk for serious illness.  Live in a nursing home or long-term care facility.  Have cancer.  Have a long-term (chronic) disease such as: ? Chronic lung disease, including chronic obstructive pulmonary disease or asthma. ? A long-term disease that lowers your body's ability to fight infection (immunocompromised). ? Heart disease, including heart failure, a condition in which the arteries that lead to the heart become narrow or blocked (coronary artery disease), a disease which makes the heart muscle thick, weak, or stiff (cardiomyopathy). ? Diabetes. ? Chronic kidney disease. ? Sickle cell disease, a condition in which red blood cells have an abnormal "sickle" shape. ? Liver disease.  Are obese. What are the signs or symptoms? Symptoms of this condition can range from mild to severe. Symptoms may appear any time from 2 to 14 days after being exposed to the virus. They include:  A fever or chills.  A cough.  Difficulty breathing.  Headaches, body aches, or muscle aches.  Runny or stuffy (congested) nose.  A sore throat.  New loss of taste or smell. Some  people may also have stomach problems, such as nausea, vomiting, or diarrhea. Other people may not have any symptoms of COVID-19. How is this diagnosed? This condition may be diagnosed based on:  Your signs and symptoms, especially if: ? You live in an area with a COVID-19 outbreak. ? You recently traveled to or from an area where the virus is common. ? You provide care for or live with a person who was diagnosed with COVID-19. ? You were exposed to a person who was diagnosed with COVID-19.  A physical exam.  Lab tests, which may include: ? Taking a sample of fluid from the back of your nose and throat (nasopharyngeal fluid), your nose, or your throat using a swab. ? A sample of mucus from your lungs (sputum). ? Blood tests.  Imaging tests, which may include, X-rays, CT scan, or ultrasound. How is this treated? At present, there is no medicine to treat COVID-19. Medicines that treat other diseases are  being used on a trial basis to see if they are effective against COVID-19. Your health care provider will talk with you about ways to treat your symptoms. For most people, the infection is mild and can be managed at home with rest, fluids, and over-the-counter medicines. Treatment for a serious infection usually takes places in a hospital intensive care unit (ICU). It may include one or more of the following treatments. These treatments are given until your symptoms improve.  Receiving fluids and medicines through an IV.  Supplemental oxygen. Extra oxygen is given through a tube in the nose, a face mask, or a hood.  Positioning you to lie on your stomach (prone position). This makes it easier for oxygen to get into the lungs.  Continuous positive airway pressure (CPAP) or bi-level positive airway pressure (BPAP) machine. This treatment uses mild air pressure to keep the airways open. A tube that is connected to a motor delivers oxygen to the body.  Ventilator. This treatment moves air  into and out of the lungs by using a tube that is placed in your windpipe.  Tracheostomy. This is a procedure to create a hole in the neck so that a breathing tube can be inserted.  Extracorporeal membrane oxygenation (ECMO). This procedure gives the lungs a chance to recover by taking over the functions of the heart and lungs. It supplies oxygen to the body and removes carbon dioxide. Follow these instructions at home: Lifestyle  If you are sick, stay home except to get medical care. Your health care provider will tell you how long to stay home. Call your health care provider before you go for medical care.  Rest at home as told by your health care provider.  Do not use any products that contain nicotine or tobacco, such as cigarettes, e-cigarettes, and chewing tobacco. If you need help quitting, ask your health care provider.  Return to your normal activities as told by your health care provider. Ask your health care provider what activities are safe for you. General instructions  Take over-the-counter and prescription medicines only as told by your health care provider.  Drink enough fluid to keep your urine pale yellow.  Keep all follow-up visits as told by your health care provider. This is important. How is this prevented?  There is no vaccine to help prevent COVID-19 infection. However, there are steps you can take to protect yourself and others from this virus. To protect yourself:   Do not travel to areas where COVID-19 is a risk. The areas where COVID-19 is reported change often. To identify high-risk areas and travel restrictions, check the CDC travel website: StageSync.si  If you live in, or must travel to, an area where COVID-19 is a risk, take precautions to avoid infection. ? Stay away from people who are sick. ? Wash your hands often with soap and water for 20 seconds. If soap and water are not available, use an alcohol-based hand sanitizer. ? Avoid  touching your mouth, face, eyes, or nose. ? Avoid going out in public, follow guidance from your state and local health authorities. ? If you must go out in public, wear a cloth face covering or face mask. Make sure your mask covers your nose and mouth. ? Avoid crowded indoor spaces. Stay at least 6 feet (2 meters) away from others. ? Disinfect objects and surfaces that are frequently touched every day. This may include:  Counters and tables.  Doorknobs and light switches.  Sinks and faucets.  Electronics,  such as phones, remote controls, keyboards, computers, and tablets. To protect others: If you have symptoms of COVID-19, take steps to prevent the virus from spreading to others.  If you think you have a COVID-19 infection, contact your health care provider right away. Tell your health care team that you think you may have a COVID-19 infection.  Stay home. Leave your house only to seek medical care. Do not use public transport.  Do not travel while you are sick.  Wash your hands often with soap and water for 20 seconds. If soap and water are not available, use alcohol-based hand sanitizer.  Stay away from other members of your household. Let healthy household members care for children and pets, if possible. If you have to care for children or pets, wash your hands often and wear a mask. If possible, stay in your own room, separate from others. Use a different bathroom.  Make sure that all people in your household wash their hands well and often.  Cough or sneeze into a tissue or your sleeve or elbow. Do not cough or sneeze into your hand or into the air.  Wear a cloth face covering or face mask. Make sure your mask covers your nose and mouth. Where to find more information  Centers for Disease Control and Prevention: StickerEmporium.tn  World Health Organization: https://thompson-craig.com/ Contact a health care provider if:  You live in or  have traveled to an area where COVID-19 is a risk and you have symptoms of the infection.  You have had contact with someone who has COVID-19 and you have symptoms of the infection. Get help right away if:  You have trouble breathing.  You have pain or pressure in your chest.  You have confusion.  You have bluish lips and fingernails.  You have difficulty waking from sleep.  You have symptoms that get worse. These symptoms may represent a serious problem that is an emergency. Do not wait to see if the symptoms will go away. Get medical help right away. Call your local emergency services (911 in the [place] Do not drive yourself to the hospital. Let the emergency medical personnel know if you think you have COVID-19. Summary  COVID-19 is a respiratory infection that is caused by a virus. It is also known as coronavirus disease or novel coronavirus. It can cause serious infections, such as pneumonia, acute respiratory distress syndrome, acute respiratory failure, or sepsis.  The virus that causes COVID-19 is contagious. This means that it can spread from person to person through droplets from breathing, speaking, singing, coughing, or sneezing.  You are more likely to develop a serious illness if you are 75 years of age or older, have a weak immune system, live in a nursing home, or have chronic disease.  There is no medicine to treat COVID-19. Your health care provider will talk with you about ways to treat your symptoms.  Take steps to protect yourself and others from infection. Wash your hands often and disinfect objects and surfaces that are frequently touched every day. Stay away from people who are sick and wear a mask if you are sick. This information is not intended to replace advice given to you by your health care provider. Make sure you discuss any questions you have with your health care provider. Document Revised: 06/16/2019 Document Reviewed: 09/22/2018 Elsevier Patient  Education  2020 [employer].  Constipation, Adult Constipation is when a person: Poops (has a bowel movement) fewer times in a week than  normal. Has a hard time pooping. Has poop that is dry, hard, or bigger than normal. Follow these instructions at home: Eating and drinking  Eat foods that have a lot of fiber, such as: Fresh fruits and vegetables. Whole grains. Beans. Eat less of foods that are high in fat, low in fiber, or overly processed, such as: [place] fries. Hamburgers. Cookies. Candy. Soda. Drink enough fluid to keep your pee (urine) clear or pale yellow. General instructions Exercise regularly or as told by your doctor. Go to the restroom when you feel like you need to poop. Do not hold it in. Take over-the-counter and prescription medicines only as told by your doctor. These include any fiber supplements. Do pelvic floor retraining exercises, such as: Doing deep breathing while relaxing your lower belly (abdomen). Relaxing your pelvic floor while pooping. Watch your condition for any changes. Keep all follow-up visits as told by your doctor. This is important. Contact a doctor if: You have pain that gets worse. You have a fever. You have not pooped for 4 days. You throw up (vomit). You are not hungry. You lose weight. You are bleeding from the anus. You have thin, pencil-like poop (stool). Get help right away if: You have a fever, and your symptoms suddenly get worse. You leak poop or have blood in your poop. Your belly feels hard or bigger than normal (is bloated). You have very bad belly pain. You feel dizzy or you faint. This information is not intended to replace advice given to you by your health care provider. Make sure you discuss any questions you have with your health care provider. Document Revised: 07/30/2017 Document Reviewed: 02/05/2016 Elsevier Patient Education  2020 [employer].

## 2020-03-18 NOTE — Progress Notes (Signed)
[facility] Patient [facility] [address] [place], [place]  [address] Phone:  [phone]   Fax:  [phone]    Established Patient Office Visit  Subjective:  Patient ID: Victoria Carpenter, female    DOB: 1976-07-09  Age: 44 y.o. MRN: [id number]  CC:  Chief Complaint  Patient presents with  . Hypertension    HPI Victoria Carpenter presents for follow up. She  has a past medical history of Hypertension, Vaginal delivery, and Vaginitis due to Gardnerella vaginalis (2010).   Hypertension Patient is here for follow-up of elevated blood pressure. She is not exercising and is adherent to a low-salt diet. Blood pressure is not monitored at home. Cardiac symptoms: none. Patient denies chest pain, dyspnea, fatigue, irregular heart beat, lower extremity edema, palpitations and syncope. Cardiovascular risk factors: hypertension, obesity (BMI >= 30 kg/m2) and sedentary lifestyle. Use of agents associated with hypertension: none. History of target organ damage: none.  Constipation Patient complains of constipation. Onset was several weeks ago. Patient has been having Infrequent formed stools per week. Defecation has been difficult. Co-Morbid conditions:none. Symptoms have stabilized. Current Health Habits: Eating fiber? yes - , Exercise? no, Adequate hydration? no. Current over the counter/prescription laxative: stimulant Senna which has been effective.   Past Medical History:  Diagnosis Date  . Hypertension   . Vaginal delivery    x 2  . Vaginitis due to Gardnerella vaginalis 2010    History reviewed. No pertinent surgical history.  Family History  Problem Relation Age of Onset  . Hypertension Mother   . Hypertension Father     Social History   Socioeconomic History  . Marital status: Single    Spouse name: Not on file  . Number of children: Not on file  . Years of education: Not on file  . Highest education level: Not on file  Occupational History  . Not on file    Tobacco Use  . Smoking status: Current Every Day Smoker    Packs/day: 0.25    Types: Cigarettes  . Smokeless tobacco: Never Used  Vaping Use  . Vaping Use: Never used  Substance and Sexual Activity  . Alcohol use: No  . Drug use: No  . Sexual activity: Yes    Birth control/protection: Condom  Other Topics Concern  . Not on file  Social History Narrative   Home Health, private sitter   Social Determinants of Health   Financial Resource Strain:   . Difficulty of Paying Living Expenses:   Food Insecurity:   . Worried About Programme researcher, broadcasting/film/video in the Last Year:   . Barista in the Last Year:   Transportation Needs:   . Freight forwarder (Medical):   Marland Kitchen Lack of Transportation (Non-Medical):   Physical Activity:   . Days of Exercise per Week:   . Minutes of Exercise per Session:   Stress:   . Feeling of Stress :   Social Connections:   . Frequency of Communication with Friends and Family:   . Frequency of Social Gatherings with Friends and Family:   . Attends Religious Services:   . Active Member of Clubs or Organizations:   . Attends Banker Meetings:   Marland Kitchen Marital Status:   Intimate Partner Violence:   . Fear of Current or Ex-Partner:   . Emotionally Abused:   Marland Kitchen Physically Abused:   . Sexually Abused:     Outpatient Medications Prior to Visit  Medication Sig Dispense  Refill  . amLODipine (NORVASC) 5 MG tablet Take 1 tablet (5 mg total) by mouth daily. 90 tablet 3  . hydrochlorothiazide (HYDRODIURIL) 25 MG tablet Take 1 tablet (25 mg total) by mouth daily. 30 tablet 2  . oxyCODONE (OXY IR/ROXICODONE) 5 MG immediate release tablet Take 1 tablet (5 mg total) by mouth every 4 (four) hours as needed for severe pain. (Patient not taking: Reported on 03/18/2020) 20 tablet 0  . nicotine (NICODERM CQ) 21 mg/24hr patch Place 1 patch (21 mg total) onto the skin daily. 30 patch 1  . ondansetron (ZOFRAN ODT) 8 MG disintegrating tablet Take 1 tablet (8 mg  total) by mouth every 8 (eight) hours as needed for nausea or vomiting. 20 tablet 0   No facility-administered medications prior to visit.    No Known Allergies  ROS Review of Systems  Gastrointestinal: Positive for constipation.      Objective:    Physical Exam Constitutional:      Appearance: She is obese.  HENT:     Head: Normocephalic and atraumatic.     Nose: Nose normal.     Mouth/Throat:     Mouth: Mucous membranes are moist.  Cardiovascular:     Rate and Rhythm: Normal rate and regular rhythm.     Pulses: Normal pulses.     Heart sounds: Normal heart sounds.  Pulmonary:     Breath sounds: Normal breath sounds.  Abdominal:     Palpations: Abdomen is soft.     Comments: Obese hypoactive  Musculoskeletal:        General: Normal range of motion.     Cervical back: Normal range of motion.  Skin:    General: Skin is warm and dry.  Neurological:     General: No focal deficit present.     Mental Status: She is alert and oriented to person, place, and time.  Psychiatric:        Mood and Affect: Mood normal.     BP (!) 148/96 (BP Location: Left Arm, Patient Position: Sitting, Cuff Size: Normal)   Pulse 74   Temp (!) 97.5 F (36.4 C) (Skin)   Resp 16   Ht 5\' 4"  (1.626 m)   Wt 196 lb (88.9 kg)   LMP 02/21/2020   SpO2 99%   BMI 33.64 kg/m  Wt Readings from Last 3 Encounters:  03/18/20 196 lb (88.9 kg)  02/15/20 203 lb (92.1 kg)  02/08/20 204 lb 0.6 oz (92.6 kg)     There are no preventive care reminders to display for this patient.  There are no preventive care reminders to display for this patient.  Lab Results  Component Value Date   TSH 1.440 12/29/2019   Lab Results  Component Value Date   WBC 8.1 02/10/2020   HGB 13.5 02/10/2020   HCT 40.2 02/10/2020   MCV 89.1 02/10/2020   PLT 323 02/10/2020   Lab Results  Component Value Date   NA 137 02/16/2020   K 4.3 02/16/2020   CO2 25 02/10/2020   GLUCOSE 92 02/16/2020   BUN 7 02/16/2020    CREATININE 0.91 02/16/2020   BILITOT 0.5 02/16/2020   ALKPHOS 70 02/16/2020   AST 10 02/16/2020   ALT 10 02/10/2020   PROT 6.9 02/16/2020   ALBUMIN 4.1 02/16/2020   CALCIUM 9.4 02/16/2020   ANIONGAP 9 02/10/2020   Lab Results  Component Value Date   CHOL 148 02/16/2020   Lab Results  Component Value Date   HDL  31 (L) 02/16/2020   Lab Results  Component Value Date   LDLCALC 101 (H) 02/16/2020   Lab Results  Component Value Date   TRIG 80 02/16/2020   Lab Results  Component Value Date   CHOLHDL 4.8 (H) 02/16/2020   Lab Results  Component Value Date   HGBA1C 5.3 02/08/2020   HGBA1C 5.3 02/08/2020   HGBA1C 5.3 (A) 02/08/2020   HGBA1C 5.3 02/08/2020      Assessment & Plan:   Problem List Items Addressed This Visit      Cardiovascular and Mediastinum   Essential hypertension - Primary   Relevant Medications   amLODipine (NORVASC) 5 MG tablet we will continue with current regimen will consider increasing amlodipine if blood pressure not well regulated.  Encourage patient to resume home blood pressure monitoring   hydrochlorothiazide (HYDRODIURIL) 25 MG tablet Encourage smoking cessation. Encourage patient to continue with lifestyle modifications with making healthy dietary choices.  Encourage patient to start regular exercise    Other Relevant Orders   Urinalysis Dipstick (Completed)    Other Visit Diagnoses    Screening for colon cancer       Relevant Orders   Ambulatory referral to Gastroenterology   Constipation, unspecified constipation type     Encourage patient to increase water intake and to try over-the-counter stool softeners to see if this would help improve overall bowel habits.      Meds ordered this encounter  Medications  . amLODipine (NORVASC) 5 MG tablet    Sig: Take 1 tablet (5 mg total) by mouth daily.    Dispense:  90 tablet    Refill:  3    Order Specific Question:   Supervising Provider    Answer:   Quentin Angst [id number]  .  hydrochlorothiazide (HYDRODIURIL) 25 MG tablet    Sig: Take 1 tablet (25 mg total) by mouth daily.    Dispense:  90 tablet    Refill:  3    Order Specific Question:   Supervising Provider    Answer:   Quentin Angst [id number]    Follow-up: Return in about 3 months (around 06/18/2020).    Barbette Merino, NP

## 2020-03-29 ENCOUNTER — Ambulatory Visit: Payer: Self-pay | Admitting: Internal Medicine

## 2020-04-15 ENCOUNTER — Encounter: Payer: Self-pay | Admitting: Gastroenterology

## 2020-06-19 ENCOUNTER — Ambulatory Visit: Payer: Self-pay | Admitting: Nurse Practitioner

## 2020-06-21 ENCOUNTER — Encounter: Payer: 59 | Admitting: Gastroenterology

## 2020-08-07 ENCOUNTER — Other Ambulatory Visit (HOSPITAL_COMMUNITY): Payer: Self-pay

## 2020-08-07 MED FILL — AMOXICILLIN 500 MG CAPSULE: 500 | 10 days supply | Qty: 30 | Fill #0

## 2020-08-07 MED FILL — IBUPROFEN 800 MG TAB: 800 | 7 days supply | Qty: 30 | Fill #0

## 2020-10-15 ENCOUNTER — Ambulatory Visit: Payer: Self-pay | Attending: Internal Medicine

## 2020-10-15 ENCOUNTER — Ambulatory Visit: Payer: Self-pay

## 2020-10-15 DIAGNOSIS — Z23 Encounter for immunization: Secondary | ICD-10-CM

## 2020-10-15 NOTE — Progress Notes (Signed)
   Covid-19 Vaccination Clinic  Name:  LISSETE MAESTAS    MRN: [id number] DOB: 03/09/1976  10/15/2020  Ms. Bagsby was observed post Covid-19 immunization for 15 minutes without incident. She was provided with Vaccine Information Sheet and instruction to access the V-Safe system.   Ms. Coulson was instructed to call 911 with any severe reactions post vaccine: Marland Kitchen Difficulty breathing  . Swelling of face and throat  . A fast heartbeat  . A bad rash all over body  . Dizziness and weakness   Immunizations Administered    Name Date Dose VIS Date Route   Moderna COVID-19 Vaccine 10/15/2020  3:02 PM 0.5 mL 06/19/2020 Intramuscular   Manufacturer: Moderna   Lot: [id number]   NDC: 23300-762-26

## 2020-11-13 ENCOUNTER — Ambulatory Visit: Payer: Self-pay

## 2020-11-14 ENCOUNTER — Telehealth: Payer: Self-pay

## 2020-11-14 NOTE — Telephone Encounter (Signed)
Called and spoke with patient made her appt for 11/18/20.

## 2020-11-14 NOTE — Telephone Encounter (Signed)
Questions for nurse  To see if she needs to schedule an appt due to her blood pressure and clearance

## 2020-11-15 ENCOUNTER — Ambulatory Visit: Payer: Self-pay | Attending: Internal Medicine

## 2020-11-15 DIAGNOSIS — Z23 Encounter for immunization: Secondary | ICD-10-CM

## 2020-11-15 NOTE — Progress Notes (Signed)
° °  Covid-19 Vaccination Clinic  Name:  OLIE SCAFFIDI    MRN: [id number] DOB: [birth year]  11/15/2020  Ms. Mclelland was observed post Covid-19 immunization for 15 minutes without incident. She was provided with Vaccine Information Sheet and instruction to access the V-Safe system.   Ms. Thomassen was instructed to call 911 with any severe reactions post vaccine:  Difficulty breathing   Swelling of face and throat   A fast heartbeat   A bad rash all over body   Dizziness and weakness   Immunizations Administered    Name Date Dose VIS Date Route   Moderna COVID-19 Vaccine 11/15/2020 11:58 AM 0.5 mL 06/19/2020 Intramuscular   Manufacturer: Moderna   Lot: [id number]   NDC: 02233-612-24

## 2020-11-18 ENCOUNTER — Other Ambulatory Visit: Payer: Self-pay | Admitting: Nurse Practitioner

## 2020-11-18 ENCOUNTER — Other Ambulatory Visit: Payer: Self-pay

## 2020-11-18 ENCOUNTER — Ambulatory Visit (INDEPENDENT_AMBULATORY_CARE_PROVIDER_SITE_OTHER): Payer: Self-pay | Admitting: Nurse Practitioner

## 2020-11-18 ENCOUNTER — Encounter: Payer: Self-pay | Admitting: Nurse Practitioner

## 2020-11-18 VITALS — BP 157/88 | HR 77 | Ht 64.0 in | Wt 199.0 lb

## 2020-11-18 DIAGNOSIS — I10 Essential (primary) hypertension: Secondary | ICD-10-CM

## 2020-11-18 DIAGNOSIS — Z01818 Encounter for other preprocedural examination: Secondary | ICD-10-CM

## 2020-11-18 MED ORDER — AMLODIPINE BESYLATE 10 MG PO TABS: 10.0000 mg | ORAL_TABLET | Freq: Every day | ORAL | 3 refills | Status: DC

## 2020-11-18 MED FILL — AMLODIPINE BESYLATE 10 MG T: 10 | 90 days supply | Qty: 90 | Fill #0

## 2020-11-18 NOTE — Progress Notes (Signed)
[facility] Patient [facility] [address] [place], [place]  [address] Phone:  [phone]   Fax:  [phone]   Established Patient Office Visit  Subjective:  Patient ID: Victoria Carpenter, female    DOB: 1976-03-10  Age: 45 y.o. MRN: [id number]  CC:  Chief Complaint  Patient presents with  . Dental Problem    Needs medical clearance    HPI Victoria Carpenter presents for follow up. She  has a past medical history of Hypertension, Vaginal delivery, and Vaginitis due to Gardnerella vaginalis (2010).   She is planning to have extraction of her wisdom teeth. However this was put off due to her elevated BP.   Hypertension Patient is here for follow-up of elevated blood pressure. She is not exercising and is adherent to a low-salt diet. Blood pressure is not  montiored at home. Cardiac symptoms: none. Patient denies chest pain, chest pressure/discomfort, claudication, dyspnea, exertional chest pressure/discomfort, fatigue, irregular heart beat, lower extremity edema, near-syncope, orthopnea, palpitations, paroxysmal nocturnal dyspnea, syncope and tachypnea. Cardiovascular risk factors: hypertension, obesity (BMI >= 30 kg/m2), sedentary lifestyle and smoking/ tobacco exposure. Use of agents associated with hypertension: none. History of target organ damage: none. She has not been compliant with the "HCTZ due to causing her go to the bathroom too much".  Past Medical History:  Diagnosis Date  . Hypertension   . Vaginal delivery    x 2  . Vaginitis due to Gardnerella vaginalis 2010    History reviewed. No pertinent surgical history.  Family History  Problem Relation Age of Onset  . Hypertension Mother   . Hypertension Father     Social History   Socioeconomic History  . Marital status: Single    Spouse name: Not on file  . Number of children: Not on file  . Years of education: Not on file  . Highest education level: Not on file  Occupational History  . Not on file   Tobacco Use  . Smoking status: Current Every Day Smoker    Packs/day: 0.25    Types: Cigarettes  . Smokeless tobacco: Never Used  Vaping Use  . Vaping Use: Never used  Substance and Sexual Activity  . Alcohol use: No  . Drug use: No  . Sexual activity: Yes    Birth control/protection: Condom  Other Topics Concern  . Not on file  Social History Narrative   Home Health, private sitter   Social Determinants of Health   Financial Resource Strain: Not on file  Food Insecurity: Not on file  Transportation Needs: Not on file  Physical Activity: Not on file  Stress: Not on file  Social Connections: Not on file  Intimate Partner Violence: Not on file    Outpatient Medications Prior to Visit  Medication Sig Dispense Refill  . amLODipine (NORVASC) 5 MG tablet Take 1 tablet (5 mg total) by mouth daily. 90 tablet 3  . hydrochlorothiazide (HYDRODIURIL) 25 MG tablet Take 1 tablet (25 mg total) by mouth daily. 90 tablet 3   No facility-administered medications prior to visit.    No Known Allergies  ROS Review of Systems    Objective:    Physical Exam Constitutional:      Appearance: She is obese.  HENT:     Head: Normocephalic and atraumatic.     Nose: Nose normal.     Mouth/Throat:     Mouth: Mucous membranes are moist.  Cardiovascular:     Rate and Rhythm: Normal rate and  regular rhythm.     Pulses: Normal pulses.     Heart sounds: Normal heart sounds.  Pulmonary:     Effort: Pulmonary effort is normal.     Comments: diminshed expiratory BS Abdominal:     Palpations: Abdomen is soft.  Musculoskeletal:        General: Normal range of motion.     Cervical back: Normal range of motion.  Skin:    General: Skin is warm and dry.     Capillary Refill: Capillary refill takes less than 2 seconds.  Neurological:     General: No focal deficit present.     Mental Status: She is alert and oriented to person, place, and time.  Psychiatric:        Mood and Affect: Mood  normal.        Behavior: Behavior normal.        Thought Content: Thought content normal.        Judgment: Judgment normal.     BP (!) 157/88   Pulse 77   Ht 5\' 4"  (1.626 m)   Wt 199 lb (90.3 kg)   SpO2 100%   BMI 34.16 kg/m  Wt Readings from Last 3 Encounters:  11/18/20 199 lb (90.3 kg)  03/18/20 196 lb (88.9 kg)  02/15/20 203 lb (92.1 kg)     Health Maintenance Due  Topic Date Due  . PAP SMEAR-Modifier  12/09/2020    There are no preventive care reminders to display for this patient.  Lab Results  Component Value Date   TSH 1.440 12/29/2019   Lab Results  Component Value Date   WBC 8.1 02/10/2020   HGB 13.5 02/10/2020   HCT 40.2 02/10/2020   MCV 89.1 02/10/2020   PLT 323 02/10/2020   Lab Results  Component Value Date   NA 137 02/16/2020   K 4.3 02/16/2020   CO2 25 02/10/2020   GLUCOSE 92 02/16/2020   BUN 7 02/16/2020   CREATININE 0.91 02/16/2020   BILITOT 0.5 02/16/2020   ALKPHOS 70 02/16/2020   AST 10 02/16/2020   ALT 10 02/10/2020   PROT 6.9 02/16/2020   ALBUMIN 4.1 02/16/2020   CALCIUM 9.4 02/16/2020   ANIONGAP 9 02/10/2020   Lab Results  Component Value Date   CHOL 148 02/16/2020   Lab Results  Component Value Date   HDL 31 (L) 02/16/2020   Lab Results  Component Value Date   LDLCALC 101 (H) 02/16/2020   Lab Results  Component Value Date   TRIG 80 02/16/2020   Lab Results  Component Value Date   CHOLHDL 4.8 (H) 02/16/2020   Lab Results  Component Value Date   HGBA1C 5.3 02/08/2020   HGBA1C 5.3 02/08/2020   HGBA1C 5.3 (A) 02/08/2020   HGBA1C 5.3 02/08/2020      Assessment & Plan:   Problem List Items Addressed This Visit      Cardiovascular and Mediastinum   Essential hypertension Persistent Increased Amolodipine 10mg  qd and discontinued HCTZ  Encouraged on going compliance with current medication regimen Encouraged home monitoring and recording BP <130/80 Eating a heart-healthy diet with less salt Encouraged regular  physical activity  Recommend Weight loss   Relevant Medications   amLODipine (NORVASC) 10 MG tablet   Other Relevant Orders   Comp. Metabolic Panel (12)    Other Visit Diagnoses    Encounter for preoperative examination for general surgical procedure -     Relevant Orders   Comp. Metabolic Panel (12)  Meds ordered this encounter  Medications  . amLODipine (NORVASC) 10 MG tablet    Sig: Take 1 tablet (10 mg total) by mouth daily.    Dispense:  90 tablet    Refill:  3    Order Specific Question:   Supervising Provider    Answer:   Quentin Angst [id number]    Follow-up: Return in about 6 weeks (around 12/30/2020) for Follow up HTN [id number] virtual visit .    Barbette Merino, NP

## 2020-11-18 NOTE — Patient Instructions (Signed)
Managing Your Hypertension Hypertension, also called high blood pressure, is when the force of the blood pressing against the walls of the arteries is too strong. Arteries are blood vessels that carry blood from your heart throughout your body. Hypertension forces the heart to work harder to pump blood and may cause the arteries to become narrow or stiff. Understanding blood pressure readings Your personal target blood pressure may vary depending on your medical conditions, your age, and other factors. A blood pressure reading includes a higher number over a lower number. Ideally, your blood pressure should be below 120/80. You should know that:  The first, or top, number is called the systolic pressure. It is a measure of the pressure in your arteries as your heart beats.  The second, or bottom number, is called the diastolic pressure. It is a measure of the pressure in your arteries as the heart relaxes. Blood pressure is classified into four stages. Based on your blood pressure reading, your health care provider may use the following stages to determine what type of treatment you need, if any. Systolic pressure and diastolic pressure are measured in a unit called mmHg. Normal  Systolic pressure: below 120.  Diastolic pressure: below 80. Elevated  Systolic pressure: 120-129.  Diastolic pressure: below 80. Hypertension stage 1  Systolic pressure: 130-139.  Diastolic pressure: 80-89. Hypertension stage 2  Systolic pressure: 140 or above.  Diastolic pressure: 90 or above. How can this condition affect me? Managing your hypertension is an important responsibility. Over time, hypertension can damage the arteries and decrease blood flow to important parts of the body, including the brain, heart, and kidneys. Having untreated or uncontrolled hypertension can lead to:  A heart attack.  A stroke.  A weakened blood vessel (aneurysm).  Heart failure.  Kidney damage.  Eye  damage.  Metabolic syndrome.  Memory and concentration problems.  Vascular dementia. What actions can I take to manage this condition? Hypertension can be managed by making lifestyle changes and possibly by taking medicines. Your health care provider will help you make a plan to bring your blood pressure within a normal range. Nutrition  Eat a diet that is high in fiber and potassium, and low in salt (sodium), added sugar, and fat. An example eating plan is called the Dietary Approaches to Stop Hypertension (DASH) diet. To eat this way: ? Eat plenty of fresh fruits and vegetables. Try to fill one-half of your plate at each meal with fruits and vegetables. ? Eat whole grains, such as whole-wheat pasta, brown rice, or whole-grain bread. Fill about one-fourth of your plate with whole grains. ? Eat low-fat dairy products. ? Avoid fatty cuts of meat, processed or cured meats, and poultry with skin. Fill about one-fourth of your plate with lean proteins such as fish, chicken without skin, beans, eggs, and tofu. ? Avoid pre-made and processed foods. These tend to be higher in sodium, added sugar, and fat.  Reduce your daily sodium intake. Most people with hypertension should eat less than 1,500 mg of sodium a day.   Lifestyle  Work with your health care provider to maintain a healthy body weight or to lose weight. Ask what an ideal weight is for you.  Get at least 30 minutes of exercise that causes your heart to beat faster (aerobic exercise) most days of the week. Activities may include walking, swimming, or biking.  Include exercise to strengthen your muscles (resistance exercise), such as weight lifting, as part of your weekly exercise routine. Try   to do these types of exercises for 30 minutes at least 3 days a week.  Do not use any products that contain nicotine or tobacco, such as cigarettes, e-cigarettes, and chewing tobacco. If you need help quitting, ask your health care  provider.  Control any long-term (chronic) conditions you have, such as high cholesterol or diabetes.  Identify your sources of stress and find ways to manage stress. This may include meditation, deep breathing, or making time for fun activities.   Alcohol use  Do not drink alcohol if: ? Your health care provider tells you not to drink. ? You are pregnant, may be pregnant, or are planning to become pregnant.  If you drink alcohol: ? Limit how much you use to:  0-1 drink a day for women.  0-2 drinks a day for men. ? Be aware of how much alcohol is in your drink. In the [place] one drink equals one 12 oz bottle of beer (355 mL), one 5 oz glass of wine (148 mL), or one 1 oz glass of hard liquor (44 mL). Medicines Your health care provider may prescribe medicine if lifestyle changes are not enough to get your blood pressure under control and if:  Your systolic blood pressure is 130 or higher.  Your diastolic blood pressure is 80 or higher. Take medicines only as told by your health care provider. Follow the directions carefully. Blood pressure medicines must be taken as told by your health care provider. The medicine does not work as well when you skip doses. Skipping doses also puts you at risk for problems. Monitoring Before you monitor your blood pressure:  Do not smoke, drink caffeinated beverages, or exercise within 30 minutes before taking a measurement.  Use the bathroom and empty your bladder (urinate).  Sit quietly for at least 5 minutes before taking measurements. Monitor your blood pressure at home as told by your health care provider. To do this:  Sit with your back straight and supported.  Place your feet flat on the floor. Do not cross your legs.  Support your arm on a flat surface, such as a table. Make sure your upper arm is at heart level.  Each time you measure, take two or three readings one minute apart and record the results. You may also need to have your  blood pressure checked regularly by your health care provider.   General information  Talk with your health care provider about your diet, exercise habits, and other lifestyle factors that may be contributing to hypertension.  Review all the medicines you take with your health care provider because there may be side effects or interactions.  Keep all visits as told by your health care provider. Your health care provider can help you create and adjust your plan for managing your high blood pressure. Where to find more information  National Heart, Lung, and Blood Institute: www.nhlbi.nih.gov  American Heart Association: www.heart.org Contact a health care provider if:  You think you are having a reaction to medicines you have taken.  You have repeated (recurrent) headaches.  You feel dizzy.  You have swelling in your ankles.  You have trouble with your vision. Get help right away if:  You develop a severe headache or confusion.  You have unusual weakness or numbness, or you feel faint.  You have severe pain in your chest or abdomen.  You vomit repeatedly.  You have trouble breathing. These symptoms may represent a serious problem that is an emergency. Do not wait   to see if the symptoms will go away. Get medical help right away. Call your local emergency services (911 in the [place] Do not drive yourself to the hospital. Summary  Hypertension is when the force of blood pumping through your arteries is too strong. If this condition is not controlled, it may put you at risk for serious complications.  Your personal target blood pressure may vary depending on your medical conditions, your age, and other factors. For most people, a normal blood pressure is less than 120/80.  Hypertension is managed by lifestyle changes, medicines, or both.  Lifestyle changes to help manage hypertension include losing weight, eating a healthy, low-sodium diet, exercising more, stopping smoking, and  limiting alcohol. This information is not intended to replace advice given to you by your health care provider. Make sure you discuss any questions you have with your health care provider. Document Revised: 09/22/2019 Document Reviewed: 07/18/2019 Elsevier Patient Education  2021 Elsevier Inc.  

## 2020-11-19 ENCOUNTER — Telehealth: Payer: Self-pay

## 2020-11-19 LAB — COMP. METABOLIC PANEL (12)
AST: 12 IU/L (ref 0–40)
Albumin/Globulin Ratio: 1.5 (ref 1.2–2.2)
Albumin: 4.3 g/dL (ref 3.8–4.8)
Alkaline Phosphatase: 72 IU/L (ref 44–121)
BUN/Creatinine Ratio: 12 (ref 9–23)
BUN: 10 mg/dL (ref 6–24)
Bilirubin Total: 0.4 mg/dL (ref 0.0–1.2)
Calcium: 9.5 mg/dL (ref 8.7–10.2)
Chloride: 102 mmol/L (ref 96–106)
Creatinine, Ser: 0.82 mg/dL (ref 0.57–1.00)
Globulin, Total: 2.9 g/dL (ref 1.5–4.5)
Glucose: 75 mg/dL (ref 65–99)
Potassium: 4.3 mmol/L (ref 3.5–5.2)
Sodium: 140 mmol/L (ref 134–144)
Total Protein: 7.2 g/dL (ref 6.0–8.5)
eGFR: 89 mL/min/{1.73_m2} (ref >59)

## 2020-11-19 NOTE — Telephone Encounter (Signed)
Called and spoke pt to inform her that letter of dental clearance was fax to the office.

## 2020-12-19 ENCOUNTER — Telehealth: Payer: Self-pay

## 2020-12-19 NOTE — Telephone Encounter (Signed)
Please advise 

## 2020-12-19 NOTE — Telephone Encounter (Signed)
Pt asking for questions to be able to take something  to stop her menstral for a couple days  [place]

## 2020-12-30 ENCOUNTER — Telehealth: Payer: Self-pay | Admitting: Nurse Practitioner

## 2020-12-30 ENCOUNTER — Other Ambulatory Visit: Payer: Self-pay

## 2021-01-09 ENCOUNTER — Other Ambulatory Visit (HOSPITAL_COMMUNITY): Payer: Self-pay

## 2021-01-09 MED ORDER — HYDROCODONE-ACETAMINOPHEN 5-325 MG PO TABS
ORAL_TABLET | ORAL | 0 refills | Status: DC
  Filled 2021-01-09: qty 16, 2d supply, fill #0

## 2021-02-25 ENCOUNTER — Other Ambulatory Visit (HOSPITAL_COMMUNITY): Payer: Self-pay

## 2021-04-04 IMAGING — CT CT ABD-PELV W/ CM
2 of 5 series · 16 of 46 positions shown, 18 images · IV contrast (omnipaque)
Comparison: None.

CLINICAL DATA: Abdominal pain

EXAM:
CT ABDOMEN AND PELVIS WITH CONTRAST
TECHNIQUE: Multidetector CT imaging of the abdomen and pelvis was performed
using the standard protocol following bolus administration of
intravenous contrast.
CONTRAST:  100mL OMNIPAQUE IOHEXOL 300 MG/ML  SOLN

[Series 2: axial st · axial · 0.80mm/px · z∈[-451,-81]mm · 13 of 86 slices shown, 15 images]
[im 6/86  soft-tissue]
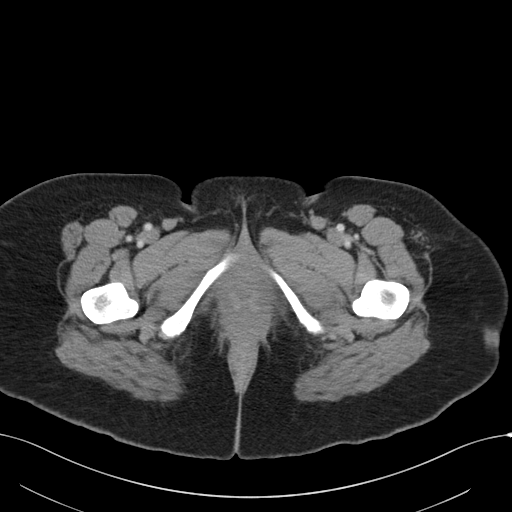
[im 6/86  bone]
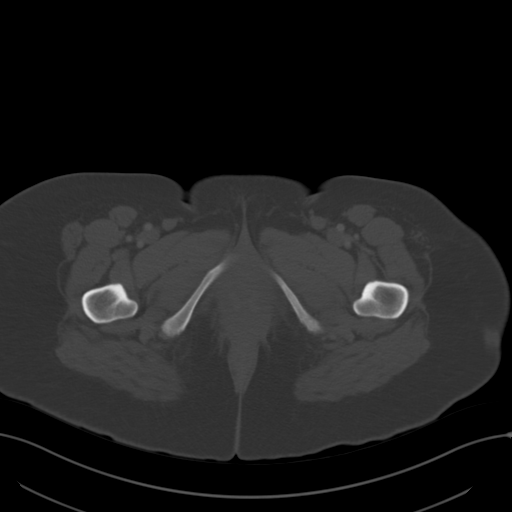
[im 11/86  soft-tissue]
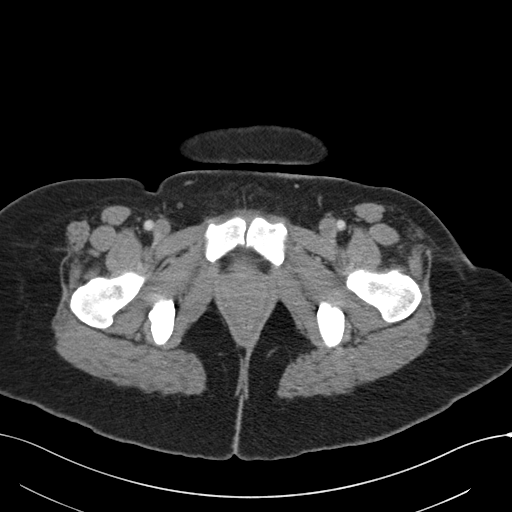
[im 16/86  soft-tissue]
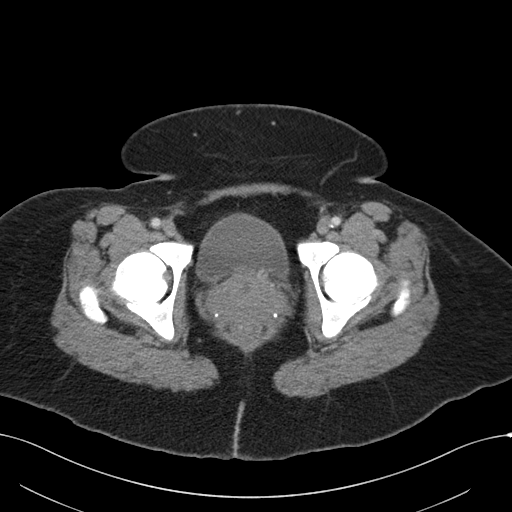
[im 27/86  soft-tissue]
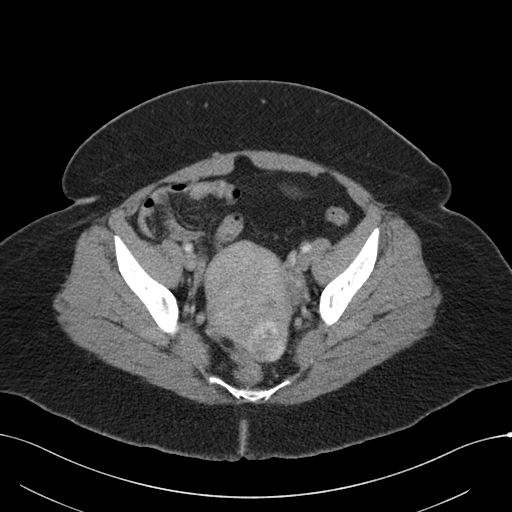
[im 32/86  soft-tissue]
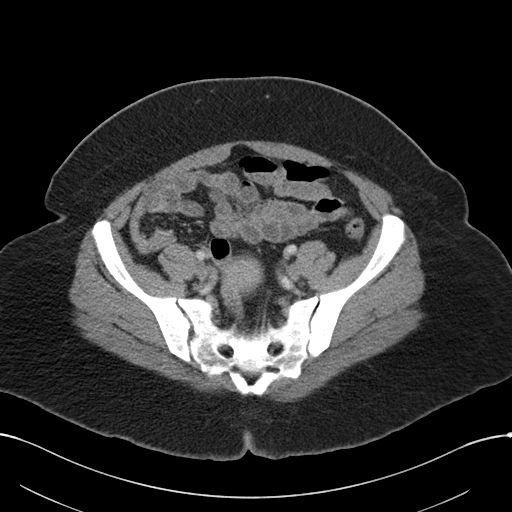
[im 38/86  soft-tissue]
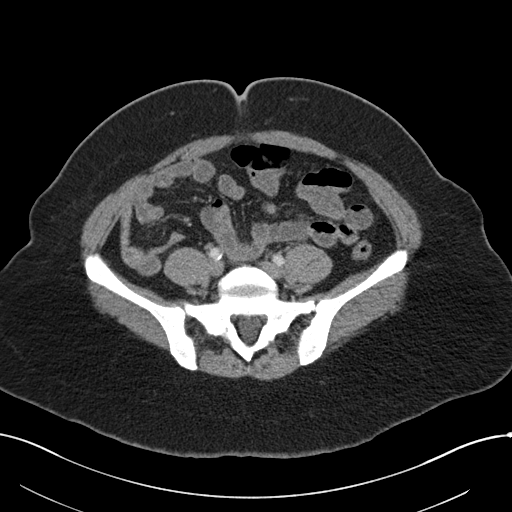
[im 43/86  soft-tissue]
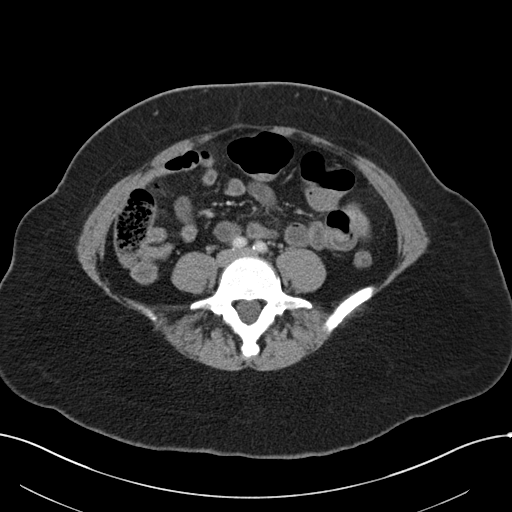
[im 48/86  soft-tissue]
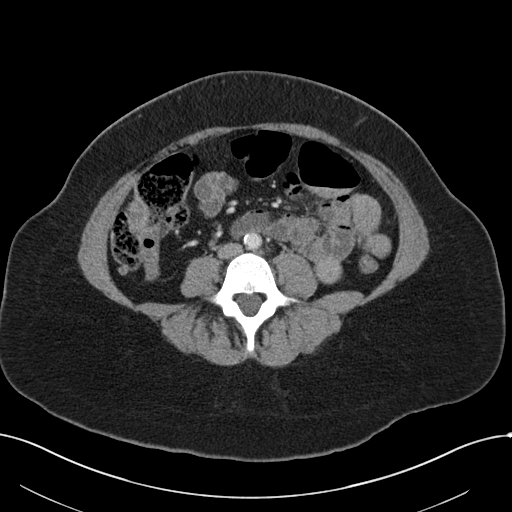
[im 54/86  soft-tissue]
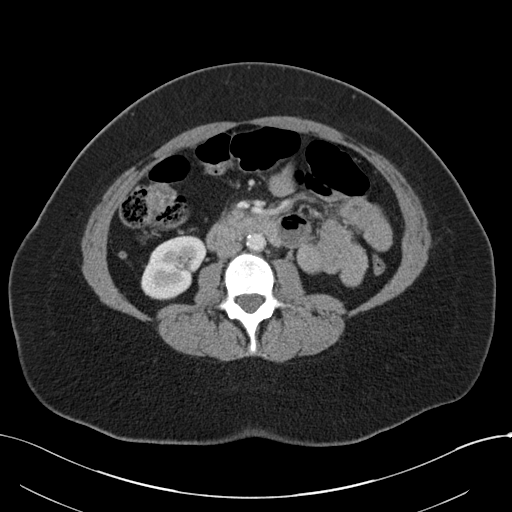
[im 54/86  bone]
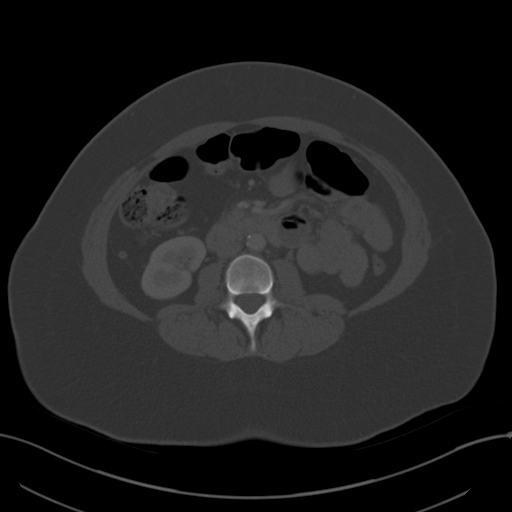
[im 59/86  soft-tissue]
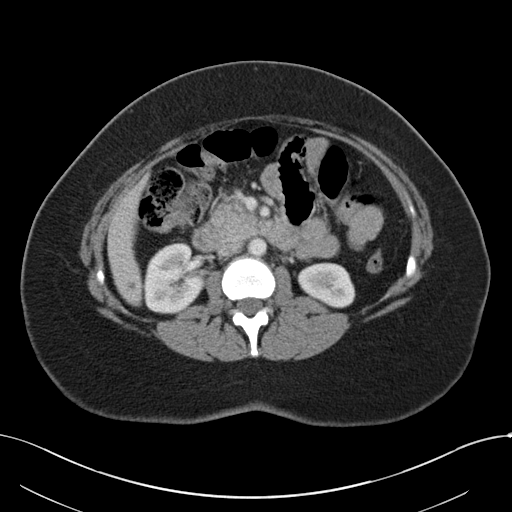
[im 70/86  soft-tissue]
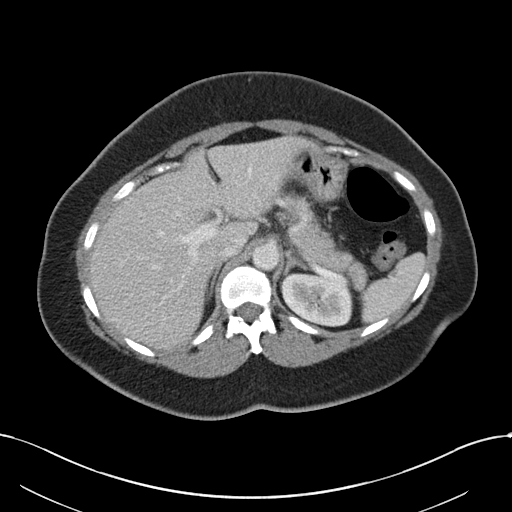
[im 75/86  soft-tissue]
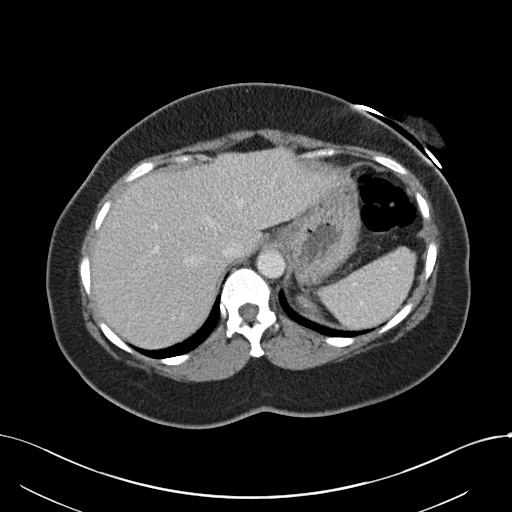
[im 80/86  soft-tissue]
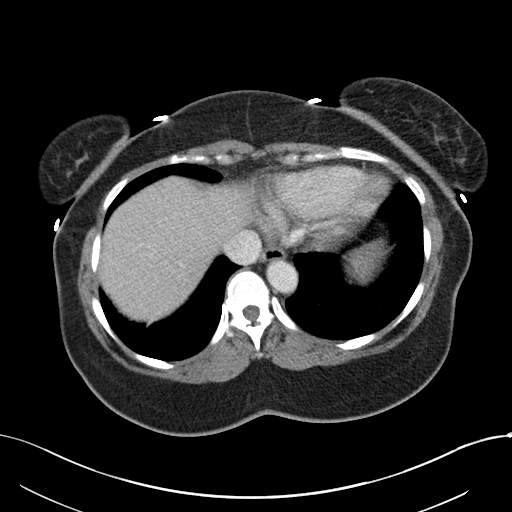

[Series 5: coronal st · coronal · 0.77mm/px · 3 of 159 slices shown]
[im 53/159  soft-tissue]
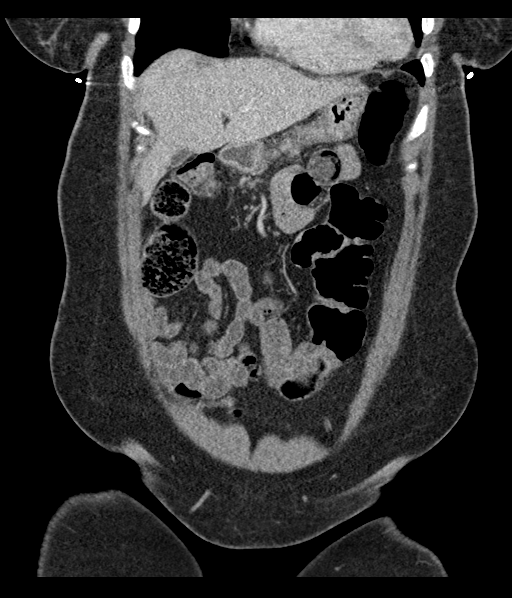
[im 71/159  soft-tissue]
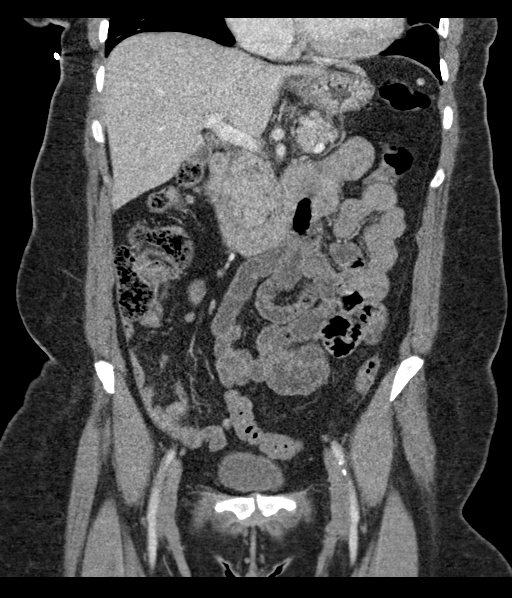
[im 88/159  soft-tissue]
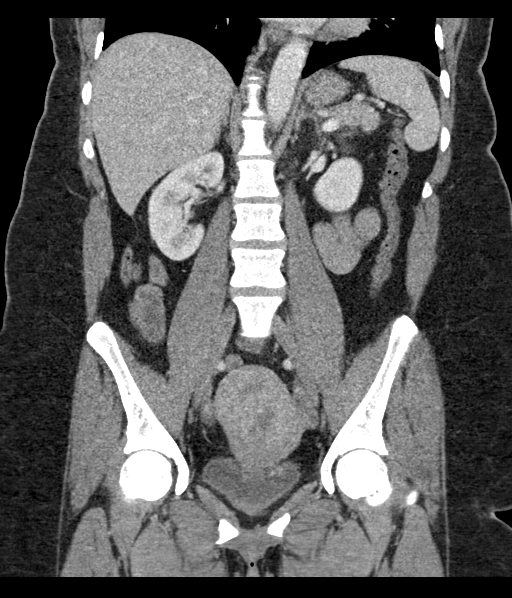

[16 of 46 positions shown; findings below may reference images not displayed]

FINDINGS: Lower chest: The lung bases are clear. The heart size is normal.

Hepatobiliary: The liver is normal. Normal gallbladder.There is no
biliary ductal dilation.

Pancreas: There is mild fat stranding about the pancreas.

Spleen: Unremarkable.

Adrenals/Urinary Tract:

--Adrenal glands: Unremarkable.

--Right kidney/ureter: No hydronephrosis or radiopaque kidney
stones.

--Left kidney/ureter: No hydronephrosis or radiopaque kidney stones.

--Urinary bladder: Unremarkable.

Stomach/Bowel:

--Stomach/Duodenum: No hiatal hernia or other gastric abnormality.
Normal duodenal course and caliber.

--Small bowel: Unremarkable.

--Colon: Unremarkable.

--Appendix: Normal.

Vascular/Lymphatic: Atherosclerotic calcification is present within
the non-aneurysmal abdominal aorta, without hemodynamically
significant stenosis.

--No retroperitoneal lymphadenopathy.

--No mesenteric lymphadenopathy.

--No pelvic or inguinal lymphadenopathy.

Reproductive: There is a fibroid uterus with multiple fibroids of
varying sizes.

Other: There is a small volume of pelvic free fluid which is likely
physiologic. No free air. The abdominal wall is normal.

Musculoskeletal. No acute displaced fractures.
IMPRESSION: 1. There is mild fat stranding about the pancreas, suggestive of
acute pancreatitis. Correlation with lipase is recommended.
2. Fibroid uterus.
3. Small volume of pelvic free fluid is likely physiologic.

Aortic Atherosclerosis ([id number]).

## 2023-05-28 ENCOUNTER — Other Ambulatory Visit: Payer: Self-pay

## 2023-05-28 ENCOUNTER — Emergency Department (HOSPITAL_BASED_OUTPATIENT_CLINIC_OR_DEPARTMENT_OTHER)
Admission: EM | Admit: 2023-05-28 | Discharge: 2023-05-28 | Disposition: A | Discharge status: Home / Self Care | Payer: BLUE CROSS/BLUE SHIELD | Attending: Emergency Medicine | Admitting: Emergency Medicine

## 2023-05-28 ENCOUNTER — Emergency Department (HOSPITAL_BASED_OUTPATIENT_CLINIC_OR_DEPARTMENT_OTHER): Payer: BLUE CROSS/BLUE SHIELD

## 2023-05-28 ENCOUNTER — Encounter (HOSPITAL_BASED_OUTPATIENT_CLINIC_OR_DEPARTMENT_OTHER): Payer: Self-pay

## 2023-05-28 DIAGNOSIS — I10 Essential (primary) hypertension: Secondary | ICD-10-CM | POA: Diagnosis not present

## 2023-05-28 DIAGNOSIS — R0789 Other chest pain: Secondary | ICD-10-CM | POA: Diagnosis not present

## 2023-05-28 DIAGNOSIS — Z79899 Other long term (current) drug therapy: Secondary | ICD-10-CM | POA: Diagnosis not present

## 2023-05-28 DIAGNOSIS — F17210 Nicotine dependence, cigarettes, uncomplicated: Secondary | ICD-10-CM | POA: Diagnosis not present

## 2023-05-28 DIAGNOSIS — R0602 Shortness of breath: Secondary | ICD-10-CM | POA: Insufficient documentation

## 2023-05-28 DIAGNOSIS — Z1152 Encounter for screening for COVID-19: Secondary | ICD-10-CM | POA: Diagnosis not present

## 2023-05-28 DIAGNOSIS — B349 Viral infection, unspecified: Secondary | ICD-10-CM

## 2023-05-28 LAB — BASIC METABOLIC PANEL
Anion gap: 9 (ref 5–15)
BUN: 9 mg/dL (ref 6–20)
CO2: 26 mmol/L (ref 22–32)
Calcium: 8.8 mg/dL — ABNORMAL LOW (ref 8.9–10.3)
Chloride: 105 mmol/L (ref 98–111)
Creatinine, Ser: 0.79 mg/dL (ref 0.44–1.00)
GFR, Estimated: >60 mL/min (ref >60)
Glucose, Bld: 79 mg/dL (ref 70–99)
Potassium: 3.6 mmol/L (ref 3.5–5.1)
Sodium: 140 mmol/L (ref 135–145)

## 2023-05-28 LAB — GROUP A STREP BY PCR: Group A Strep by PCR: NOT DETECTED

## 2023-05-28 LAB — CBC
HCT: 38.7 % (ref 36.0–46.0)
Hemoglobin: 12.9 g/dL (ref 12.0–15.0)
MCH: 29.9 pg (ref 26.0–34.0)
MCHC: 33.3 g/dL (ref 30.0–36.0)
MCV: 89.8 fL (ref 80.0–100.0)
Platelets: 262 10*3/uL (ref 150–400)
RBC: 4.31 MIL/uL (ref 3.87–5.11)
RDW: 13.9 % (ref 11.5–15.5)
WBC: 7.6 10*3/uL (ref 4.0–10.5)
nRBC: 0 % (ref 0.0–0.2)

## 2023-05-28 LAB — HEPATIC FUNCTION PANEL
ALT: 11 U/L (ref 0–44)
AST: 15 U/L (ref 15–41)
Albumin: 3.9 g/dL (ref 3.5–5.0)
Alkaline Phosphatase: 61 U/L (ref 38–126)
Bilirubin, Direct: 0.1 mg/dL (ref 0.0–0.2)
Indirect Bilirubin: 0.6 mg/dL (ref 0.3–0.9)
Total Bilirubin: 0.7 mg/dL (ref 0.3–1.2)
Total Protein: 7.5 g/dL (ref 6.5–8.1)

## 2023-05-28 LAB — PREGNANCY, URINE: Preg Test, Ur: NEGATIVE

## 2023-05-28 LAB — BRAIN NATRIURETIC PEPTIDE: B Natriuretic Peptide: 29.7 pg/mL (ref 0.0–100.0)

## 2023-05-28 LAB — TROPONIN I (HIGH SENSITIVITY)
Troponin I (High Sensitivity): 4 ng/L (ref <18)
Troponin I (High Sensitivity): 4 ng/L (ref <18)

## 2023-05-28 LAB — SARS CORONAVIRUS 2 BY RT PCR: SARS Coronavirus 2 by RT PCR: NEGATIVE

## 2023-05-28 MED ORDER — AMLODIPINE BESYLATE 10 MG PO TABS: ORAL_TABLET | Freq: Every day | ORAL | 1 refills | Status: DC

## 2023-05-28 MED ORDER — ALBUTEROL SULFATE HFA 108 (90 BASE) MCG/ACT IN AERS
1.0000 | INHALATION_SPRAY | Freq: Once | RESPIRATORY_TRACT | Status: AC
  Administered 2023-05-28: 2 via RESPIRATORY_TRACT
  Filled 2023-05-28: qty 6.7

## 2023-05-28 MED ORDER — AMLODIPINE BESYLATE 5 MG PO TABS: 5.0000 mg | ORAL_TABLET | Freq: Once | ORAL | Status: DC

## 2023-05-28 MED ORDER — IPRATROPIUM-ALBUTEROL 0.5-2.5 (3) MG/3ML IN SOLN
3.0000 mL | Freq: Once | RESPIRATORY_TRACT | Status: AC
  Administered 2023-05-28: 3 mL via RESPIRATORY_TRACT
  Filled 2023-05-28: qty 3

## 2023-05-28 MED ORDER — LORATADINE 10 MG PO TABS: 10.0000 mg | ORAL_TABLET | Freq: Every day | ORAL | 0 refills | Status: AC

## 2023-05-28 MED ORDER — HYDROCHLOROTHIAZIDE 25 MG PO TABS: 25.0000 mg | ORAL_TABLET | Freq: Once | ORAL | Status: DC

## 2023-05-28 MED ORDER — AMLODIPINE BESYLATE 5 MG PO TABS
10.0000 mg | ORAL_TABLET | Freq: Once | ORAL | Status: AC
  Administered 2023-05-28: 10 mg via ORAL
  Filled 2023-05-28: qty 2

## 2023-05-28 MED ORDER — FLUTICASONE PROPIONATE 50 MCG/ACT NA SUSP: 1.0000 | Freq: Every day | NASAL | 2 refills | Status: AC

## 2023-05-28 NOTE — Discharge Instructions (Addendum)
You were seen in the emergency department today for evaluation of your cough and cold symptoms.  Tested negative for COVID.  Your labs are grossly unremarkable.  I think you are having some seasonal allergies or viral illness.  I have sent you into medications to help with this.  No same and Flonase which is a nasal sprays to use.  I have also given a daily medication called Claritin which will take once daily.  Have also sent in a refill of your blood pressure medication to take as well.  You were given an albuterol inhaler here to help with your coughing.  Have also given you a few days off of work.  It is important that you follow-up with your primary care doctor to be reestablished on your blood pressure medications even though I have also given you a few months of supply.  I have included the information in the form on recording her blood pressure that she should bring to your first primary care appointment so they can see how to better control your blood pressure.  If you have any worsening chest pain, shortness of breath, fever, coughing up any blood, passing out, please return to your nearest emergency department for reevaluation.  If you have any concerns, new or worsening symptoms, please return to your nearest emergency department for reevaluation.  Contact a doctor if: Your condition does not get better as soon as expected. You have a hard time doing your normal activities, even after you rest. You have new symptoms. You cannot walk up stairs. You cannot exercise the way you normally do. Get help right away if: Your shortness of breath gets worse. You have trouble breathing when you are resting. You feel light-headed or you faint. You have a cough that is not helped by medicines. You cough up blood. You have pain with breathing. You have pain in your chest, arms, shoulders, or belly (abdomen). You have a fever. These symptoms may be an emergency. Get help right away. Call 911. Do not  wait to see if the symptoms will go away. Do not drive yourself to the hospital.

## 2023-05-28 NOTE — ED Provider Notes (Signed)
[place] [facility] AT [facility] Provider Note   CSN: [id number] Arrival date & time: 05/28/23  1456     History Chief Complaint  Patient presents with   Shortness of Breath   Cough   Chest Pain    Victoria Carpenter is a 47 y.o. female with h/o HTN non compliant with medications presents to the ER today for evaluation of shortness of breath.  Patient reports that yesterday she started to have cough and cold symptoms.  She reports that she started to have some runny nose, nasal gesturing, sore throat, and a productive cough.  Denies any fever or chills.  She reports that while today she felt short of breath and more fatigued.  She reports that she does have chest pain with coughing but not with exertion.  She denies any palpitations or leg swelling.  She denies any abdominal pain, nausea, vomiting, diarrhea, constipation.  She denies any hemoptysis.  She denies any history of factor V Leiden or von Willebrand's, denies any personal history of cancer or any hormone use.  She denies any unilateral leg swelling.  She has been off her blood pressure meds for over 2 years and has not followed with a primary care doctor.  She reports that she has been stressed at work lately and thinks that she may need some time off.  She denies any allergies to medications.  She is 1/2 pack/day smoker.  Denies any EtOH or illicit drug use.   Shortness of Breath Associated symptoms: chest pain, cough and sore throat   Associated symptoms: no abdominal pain, no ear pain, no fever, no headaches, no vomiting and no wheezing   Cough Associated symptoms: chest pain, rhinorrhea, shortness of breath and sore throat   Associated symptoms: no chills, no ear pain, no fever, no headaches and no wheezing   Chest Pain Associated symptoms: cough and shortness of breath   Associated symptoms: no abdominal pain, no dizziness, no dysphagia, no fever, no headache, no nausea, no palpitations and no  vomiting        Home Medications Prior to Admission medications   Medication Sig Start Date End Date Taking? Authorizing Provider  fluticasone (FLONASE) 50 MCG/ACT nasal spray Place 1 spray into both nostrils daily. 05/28/23  Yes Achille Rich, PA-C  loratadine (CLARITIN) 10 MG tablet Take 1 tablet (10 mg total) by mouth daily. 05/28/23  Yes Achille Rich, PA-C  amLODipine (NORVASC) 10 MG tablet TAKE 1 TABLET BY MOUTH DAILY. 05/28/23 05/27/24  Achille Rich, PA-C  HYDROcodone-acetaminophen (NORCO/VICODIN) 5-325 MG tablet Take 1 tablet by mouth every 4-6 hours as needed for pain 01/09/21         Allergies    Patient has no known allergies.    Review of Systems   Review of Systems  Constitutional:  Negative for chills and fever.  HENT:  Positive for congestion, rhinorrhea and sore throat. Negative for ear discharge, ear pain and trouble swallowing.   Respiratory:  Positive for cough and shortness of breath. Negative for wheezing.   Cardiovascular:  Positive for chest pain. Negative for palpitations and leg swelling.  Gastrointestinal:  Negative for abdominal pain, constipation, diarrhea, nausea and vomiting.  Genitourinary:  Negative for dysuria and hematuria.  Neurological:  Negative for dizziness, syncope, light-headedness and headaches.    Physical Exam Updated Vital Signs BP (!) 183/103   Pulse 71   Temp 98.4 F (36.9 C) (Oral)   Resp 19   Ht 5\' 7"  (1.702 m)  Wt 90 kg   LMP 05/18/2023 (Exact Date)   SpO2 100%   BMI 31.08 kg/m  Physical Exam Vitals and nursing note reviewed.  Constitutional:      General: She is not in acute distress.    Appearance: She is not ill-appearing, toxic-appearing or diaphoretic.  HENT:     Head: Normocephalic and atraumatic.     Right Ear: Tympanic membrane, ear canal and external ear normal.     Left Ear: Tympanic membrane, ear canal and external ear normal.     Nose:     Comments: Bilateral nasal turbinate edema and erythema with scant  clear nasal discharge.    Mouth/Throat:     Mouth: Mucous membranes are moist.     Comments: No pharyngeal erythema, exudate, or edema noted.  Uvula midline.  Airway patent.  Moist mucous membranes. No sublingual elevation.  Eyes:     General: No scleral icterus.    Conjunctiva/sclera: Conjunctivae normal.  Cardiovascular:     Rate and Rhythm: Normal rate.     Pulses:          Radial pulses are 2+ on the right side and 2+ on the left side.       Dorsalis pedis pulses are 2+ on the right side and 2+ on the left side.       Posterior tibial pulses are 2+ on the right side and 2+ on the left side.  Pulmonary:     Effort: Pulmonary effort is normal. No respiratory distress.     Breath sounds: Normal breath sounds. No decreased breath sounds, wheezing or rhonchi.  Chest:     Chest wall: Tenderness present.     Comments: Diffuse chest wall tenderness to palpation. No step offs or deformities.  Abdominal:     General: Abdomen is flat.     Palpations: Abdomen is soft.     Tenderness: There is no abdominal tenderness. There is no guarding or rebound.  Musculoskeletal:        General: No deformity.     Cervical back: Normal range of motion.     Right lower leg: No tenderness. No edema.     Left lower leg: No tenderness. No edema.  Skin:    General: Skin is warm and dry.  Neurological:     General: No focal deficit present.     Mental Status: She is alert.     ED Results / Procedures / Treatments   Labs (all labs ordered are listed, but only abnormal results are displayed) Labs Reviewed  BASIC METABOLIC PANEL - Abnormal; Notable for the following components:      Result Value   Calcium 8.8 (*)    All other components within normal limits  SARS CORONAVIRUS 2 BY RT PCR  GROUP A STREP BY PCR  CBC  PREGNANCY, URINE  BRAIN NATRIURETIC PEPTIDE  HEPATIC FUNCTION PANEL  TROPONIN I (HIGH SENSITIVITY)  TROPONIN I (HIGH SENSITIVITY)    EKG EKG Interpretation Date/Time:  Friday  May 28 2023 15:16:09 EDT Ventricular Rate:  76 PR Interval:  163 QRS Duration:  83 QT Interval:  368 QTC Calculation: 414 R Axis:   57  Text Interpretation: Sinus rhythm Left atrial enlargement Probable left ventricular hypertrophy No significant change since last tracing Confirmed by Elayne Snare ([id number]) on 05/28/2023 4:00:39 PM  Radiology DG Chest 2 View  Result Date: 05/28/2023 CLINICAL DATA:  Shortness of breath.  Chest tightness.  Cough. EXAM: CHEST - 2 VIEW COMPARISON:  10/12/2013.  FINDINGS: Redemonstration of linear area of scarring/atelectasis in the left upper lobe. Bilateral lung fields are otherwise clear. Bilateral costophrenic angles are clear. Normal cardio-mediastinal silhouette. No acute osseous abnormalities. The soft tissues are within normal limits. IMPRESSION: 1. No active cardiopulmonary disease. Electronically Signed   By: Jules Schick M.D.   On: 05/28/2023 16:57    Procedures Procedures   Medications Ordered in ED Medications  ipratropium-albuterol (DUONEB) 0.5-2.5 (3) MG/3ML nebulizer solution 3 mL (3 mLs Nebulization Given 05/28/23 1648)  amLODipine (NORVASC) tablet 10 mg (10 mg Oral Given 05/28/23 1718)  albuterol (VENTOLIN HFA) 108 (90 Base) MCG/ACT inhaler 1-2 puff (2 puffs Inhalation Given 05/28/23 1931)    ED Course/ Medical Decision Making/ A&P    Medical Decision Making Amount and/or Complexity of Data Reviewed Labs: ordered.  Risk OTC drugs. Prescription drug management.   47 y.o. female presents to the ER for evaluation of SOB and cough/cold symptoms. Differential diagnosis includes but is not limited to CHF, pericardial effusion/tamponade, arrhythmias, ACS, COPD, asthma, bronchitis, pneumonia, pneumothorax, PE, anemia. Vital signs show significantly elevated BP, otherwise unremarkable. Physical exam as noted above.   Duoneb ordered for coughing.   I discussed this case with my attending and she recommends her previous BP med dose of  10mg  of amlodipine and re-evaluate.   I independently reviewed and interpreted the patient's labs. Troponin at 4, repeat pending. CBC and hepatic function panel unremarkable. COVID negative. BMP shows mild hypocalcemia at 8.8 otherwise unremarkable. Pregnancy is negative. BNP within normal limits. Strep negative.  EKG reviewed and interpreted by my attending and read as Sinus rhythm Left atrial enlargement Probable left ventricular hypertrophy No significant change since last tracing.  CXR shows  1. No active cardiopulmonary disease. Per radiologist's read.   On re-evaluation, the patient reports feeling significantly better after the breathing treatment. She is satting 100% on RA in no acute distress. Maintained O2 sat while ambulating. At this time, I have a low suspicion for any pneumonia given that she is afebrile.  She is PERC negative and low Wells criteria.  Considered hypertensive urgency however she is having more complaints of cough or cold symptoms and she is true shortness of breath or chest pain.  Her chest pain is reproducible upon palpation.  Likely muscle skeletal due to her coughing.  Her labs are grossly unremarkable as well as her chest x-ray.  Given that the symptoms started shortly prior to arrival, will wait for second troponin for pending.  Ultimately, the plan is to discharge home with Claritin, Flonase, give her an albuterol inhaler while she is here given that she did have some improvement with her symptoms with that.  I also have given her a prescription for amlodipine 10mg  as well given that it from her previous chart evaluation visit from 2022 that is what she was prescribed. Her BP has improved from 212 to 183 systolic. Lower suspicion for ACS, but will wait for delta troponin. I have a lower suspicion for CHF given that she looks euvolemic with a normal BNP. Likely viral illness/allergies. Will hand off for the patient to next shift to follow up on second troponin.   7:35 PM  Care of [name]  transferred to [employer] at the end of my shift as the patient will require reassessment once labs/imaging have resulted. Patient presentation, ED course, and plan of care discussed with review of all pertinent labs and imaging. Please see his/her note for further details regarding further ED course  and disposition. Plan at time of handoff is follow up with second troponin. This may be altered or completely changed at the discretion of the oncoming team pending results of further workup.  Portions of this report may have been transcribed using voice recognition software. Every effort was made to ensure accuracy; however, inadvertent computerized transcription errors may be present.   Final Clinical Impression(s) / ED Diagnoses Final diagnoses:  Essential hypertension    Rx / DC Orders ED Discharge Orders          Ordered    fluticasone (FLONASE) 50 MCG/ACT nasal spray  Daily        05/28/23 1914    loratadine (CLARITIN) 10 MG tablet  Daily        05/28/23 1914    amLODipine (NORVASC) 10 MG tablet  Daily        05/28/23 1914              Achille Rich, PA-C 05/28/23 1954    Rexford Maus, DO 05/28/23 2224

## 2023-05-28 NOTE — ED Notes (Signed)
RT had patient ambulate with pulse ox attached. Her O2 saturation remained 92% and higher during her ambulation.

## 2023-05-28 NOTE — ED Notes (Signed)
Snacks and OJ given to pt, RT bedside, rpt trop sent. Denies further needs at this time.

## 2023-05-28 NOTE — ED Triage Notes (Signed)
The patient stated she was having chest tightness, cough and shortness of breath. No fever.

## 2023-05-31 ENCOUNTER — Encounter: Payer: Self-pay | Admitting: Internal Medicine

## 2023-05-31 ENCOUNTER — Ambulatory Visit: Payer: BLUE CROSS/BLUE SHIELD | Attending: Internal Medicine | Admitting: Internal Medicine

## 2023-05-31 ENCOUNTER — Other Ambulatory Visit (HOSPITAL_COMMUNITY): Payer: Self-pay

## 2023-05-31 VITALS — BP 128/83 | HR 82 | Ht 67.0 in | Wt 159.0 lb

## 2023-05-31 DIAGNOSIS — Z1211 Encounter for screening for malignant neoplasm of colon: Secondary | ICD-10-CM | POA: Diagnosis not present

## 2023-05-31 DIAGNOSIS — Z2821 Immunization not carried out because of patient refusal: Secondary | ICD-10-CM

## 2023-05-31 DIAGNOSIS — F17200 Nicotine dependence, unspecified, uncomplicated: Secondary | ICD-10-CM | POA: Diagnosis not present

## 2023-05-31 DIAGNOSIS — I10 Essential (primary) hypertension: Secondary | ICD-10-CM | POA: Diagnosis not present

## 2023-05-31 DIAGNOSIS — B349 Viral infection, unspecified: Secondary | ICD-10-CM

## 2023-05-31 MED ORDER — NICOTINE 14 MG/24HR TD PT24
14.0000 mg | MEDICATED_PATCH | Freq: Every day | TRANSDERMAL | 0 refills | Status: AC
  Filled 2023-05-31: qty 28, 28d supply, fill #0

## 2023-05-31 MED ORDER — AMLODIPINE BESYLATE 10 MG PO TABS
ORAL_TABLET | Freq: Every day | ORAL | 1 refills | Status: AC
  Filled 2023-05-31: qty 30, 30d supply, fill #0

## 2023-05-31 NOTE — Patient Instructions (Addendum)
Call 1800-QuitNow to get free nicotine patches.    DASH Eating Plan DASH stands for Dietary Approaches to Stop Hypertension. The DASH eating plan is a healthy eating plan that has been shown to: Lower high blood pressure (hypertension). Reduce your risk for type 2 diabetes, heart disease, and stroke. Help with weight loss. What are tips for following this plan? Reading food labels Check food labels for the amount of salt (sodium) per serving. Choose foods with less than 5 percent of the Daily Value (DV) of sodium. In general, foods with less than 300 milligrams (mg) of sodium per serving fit into this eating plan. To find whole grains, look for the word "whole" as the first word in the ingredient list. Shopping Buy products labeled as "low-sodium" or "no salt added." Buy fresh foods. Avoid canned foods and pre-made or frozen meals. Cooking Try not to add salt when you cook. Use salt-free seasonings or herbs instead of table salt or sea salt. Check with your health care provider or pharmacist before using salt substitutes. Do not fry foods. Cook foods in healthy ways, such as baking, boiling, grilling, roasting, or broiling. Cook using oils that are good for your heart. These include olive, canola, avocado, soybean, and sunflower oil. Meal planning  Eat a balanced diet. This should include: 4 or more servings of fruits and 4 or more servings of vegetables each day. Try to fill half of your plate with fruits and vegetables. 6-8 servings of whole grains each day. 6 or less servings of lean meat, poultry, or fish each day. 1 oz is 1 serving. A 3 oz (85 g) serving of meat is about the same size as the palm of your hand. One egg is 1 oz (28 g). 2-3 servings of low-fat dairy each day. One serving is 1 cup (237 mL). 1 serving of nuts, seeds, or beans 5 times each week. 2-3 servings of heart-healthy fats. Healthy fats called omega-3 fatty acids are found in foods such as walnuts, flaxseeds,  fortified milks, and eggs. These fats are also found in cold-water fish, such as sardines, salmon, and mackerel. Limit how much you eat of: Canned or prepackaged foods. Food that is high in trans fat, such as fried foods. Food that is high in saturated fat, such as fatty meat. Desserts and other sweets, sugary drinks, and other foods with added sugar. Full-fat dairy products. Do not salt foods before eating. Do not eat more than 4 egg yolks a week. Try to eat at least 2 vegetarian meals a week. Eat more home-cooked food and less restaurant, buffet, and fast food. Lifestyle When eating at a restaurant, ask if your food can be made with less salt or no salt. If you drink alcohol: Limit how much you have to: 0-1 drink a day if you are female. 0-2 drinks a day if you are female. Know how much alcohol is in your drink. In the [place] one drink is one 12 oz bottle of beer (355 mL), one 5 oz glass of wine (148 mL), or one 1 oz glass of hard liquor (44 mL). General information Avoid eating more than 2,300 mg of salt a day. If you have hypertension, you may need to reduce your sodium intake to 1,500 mg a day. Work with your provider to stay at a healthy body weight or lose weight. Ask what the best weight range is for you. On most days of the week, get at least 30 minutes of exercise that causes your  heart to beat faster. This may include walking, swimming, or biking. Work with your provider or dietitian to adjust your eating plan to meet your specific calorie needs. What foods should I eat? Fruits All fresh, dried, or frozen fruit. Canned fruits that are in their natural juice and do not have sugar added to them. Vegetables Fresh or frozen vegetables that are raw, steamed, roasted, or grilled. Low-sodium or reduced-sodium tomato and vegetable juice. Low-sodium or reduced-sodium tomato sauce and tomato paste. Low-sodium or reduced-sodium canned vegetables. Grains Whole-grain or whole-wheat bread.  Whole-grain or whole-wheat pasta. Brown rice. Orpah Cobb. Bulgur. Whole-grain and low-sodium cereals. Pita bread. Low-fat, low-sodium crackers. Whole-wheat flour tortillas. Meats and other proteins Skinless chicken or [place]. Ground chicken or [place]. Pork with fat trimmed off. Fish and seafood. Egg whites. Dried beans, peas, or lentils. Unsalted nuts, nut butters, and seeds. Unsalted canned beans. Lean cuts of beef with fat trimmed off. Low-sodium, lean precooked or cured meat, such as sausages or meat loaves. Dairy Low-fat (1%) or fat-free (skim) milk. Reduced-fat, low-fat, or fat-free cheeses. Nonfat, low-sodium ricotta or cottage cheese. Low-fat or nonfat yogurt. Low-fat, low-sodium cheese. Fats and oils Soft margarine without trans fats. Vegetable oil. Reduced-fat, low-fat, or light mayonnaise and salad dressings (reduced-sodium). Canola, safflower, olive, avocado, soybean, and sunflower oils. Avocado. Seasonings and condiments Herbs. Spices. Seasoning mixes without salt. Other foods Unsalted popcorn and pretzels. Fat-free sweets. The items listed above may not be all the foods and drinks you can have. Talk to a dietitian to learn more. What foods should I avoid? Fruits Canned fruit in a light or heavy syrup. Fried fruit. Fruit in cream or butter sauce. Vegetables Creamed or fried vegetables. Vegetables in a cheese sauce. Regular canned vegetables that are not marked as low-sodium or reduced-sodium. Regular canned tomato sauce and paste that are not marked as low-sodium or reduced-sodium. Regular tomato and vegetable juices that are not marked as low-sodium or reduced-sodium. Rosita Fire. Olives. Grains Baked goods made with fat, such as croissants, muffins, or some breads. Dry pasta or rice meal packs. Meats and other proteins Fatty cuts of meat. Ribs. Fried meat. Tomasa Blase. Bologna, salami, and other precooked or cured meats, such as sausages or meat loaves, that are not lean and low in  sodium. Fat from the back of a pig (fatback). Bratwurst. Salted nuts and seeds. Canned beans with added salt. Canned or smoked fish. Whole eggs or egg yolks. Chicken or [place] with skin. Dairy Whole or 2% milk, cream, and half-and-half. Whole or full-fat cream cheese. Whole-fat or sweetened yogurt. Full-fat cheese. Nondairy creamers. Whipped toppings. Processed cheese and cheese spreads. Fats and oils Butter. Stick margarine. Lard. Shortening. Ghee. Bacon fat. Tropical oils, such as coconut, palm kernel, or palm oil. Seasonings and condiments Onion salt, garlic salt, seasoned salt, table salt, and sea salt. Worcestershire sauce. Tartar sauce. Barbecue sauce. Teriyaki sauce. Soy sauce, including reduced-sodium soy sauce. Steak sauce. Canned and packaged gravies. Fish sauce. Oyster sauce. Cocktail sauce. Store-bought horseradish. Ketchup. Mustard. Meat flavorings and tenderizers. Bouillon cubes. Hot sauces. Pre-made or packaged marinades. Pre-made or packaged taco seasonings. Relishes. Regular salad dressings. Other foods Salted popcorn and pretzels. The items listed above may not be all the foods and drinks you should avoid. Talk to a dietitian to learn more. Where to find more information National Heart, Lung, and Blood Institute (NHLBI): BuffaloDryCleaner.gl American Heart Association (AHA): heart.org Academy of Nutrition and Dietetics: eatright.org National Kidney Foundation (NKF): kidney.org This information is not intended to replace advice  given to you by your health care provider. Make sure you discuss any questions you have with your health care provider. Document Revised: 09/03/2022 Document Reviewed: 09/03/2022 Elsevier Patient Education  2024 [employer].

## 2023-05-31 NOTE — Progress Notes (Signed)
Patient ID: Victoria Carpenter, female    DOB: 1976/05/02  MRN: [id number]  CC: Hospitalization Follow-up (Hospitalization f/u. Ottis Stain on L flank X3 days/No to flu vax)   Subjective: Victoria Carpenter is a 47 y.o. female who presents for ER f/u Her concerns today include:  Pt with hx of HTN  Pt seen in ER 05/28/2023 with some upper respiratory symptoms.  Rapid strep test and COVID-19 test negative.  Chest x-ray without acute findings.  Discharged with Flonase nasal spray and Claritin. Still a little congestion but feels better  HTN: Blood pressure noted to be elevated in the emergency room.  Amlodipine 10 mg prescribed which she was on in the past.  Taking the Norvasc 10 mg daily.  Does not add extra salt to food but eats out daily but not fast food.   Smokes 1/3 pk a day.  Smoked since age 70.  Tried the patches before but was not serious about quitting.  Feels she is now mentally ready to quit.    HM: She declines the flu vaccine and COVID booster.  Due for colon cancer screening.  No family history of colon cancer. Patient Active Problem List   Diagnosis Date Noted   Tobacco dependence 06/09/2015   Essential hypertension 06/07/2015   Pap smear for cervical cancer screening 01/21/2011   Need for tetanus booster 01/21/2011     Current Outpatient Medications on File Prior to Visit  Medication Sig Dispense Refill   fluticasone (FLONASE) 50 MCG/ACT nasal spray Place 1 spray into both nostrils daily. 18.2 mL 2   loratadine (CLARITIN) 10 MG tablet Take 1 tablet (10 mg total) by mouth daily. 30 tablet 0   No current facility-administered medications on file prior to visit.    No Known Allergies  Social History   Socioeconomic History   Marital status: Single    Spouse name: Not on file   Number of children: Not on file   Years of education: Not on file   Highest education level: Not on file  Occupational History   Not on file  Tobacco Use   Smoking status: Every Day     Current packs/day: 0.25    Types: Cigarettes   Smokeless tobacco: Never  Vaping Use   Vaping status: Never Used  Substance and Sexual Activity   Alcohol use: No   Drug use: No   Sexual activity: Yes    Birth control/protection: Condom  Other Topics Concern   Not on file  Social History Narrative   Home Health, private sitter   Social Determinants of Health   Financial Resource Strain: Not on file  Food Insecurity: Not on file  Transportation Needs: Not on file  Physical Activity: Not on file  Stress: Not on file  Social Connections: Not on file  Intimate Partner Violence: Not on file    Family History  Problem Relation Age of Onset   Hypertension Mother    Hypertension Father     History reviewed. No pertinent surgical history.  ROS: Review of Systems Negative except as stated above  PHYSICAL EXAM: BP 128/83 (BP Location: Left Arm, Patient Position: Sitting, Cuff Size: Normal)   Pulse 82   Ht 5\' 7"  (1.702 m)   Wt 159 lb (72.1 kg)   LMP 05/18/2023 (Exact Date)   SpO2 99%   BMI 24.90 kg/m   Physical Exam  General appearance - alert, well appearing, middle-age African-American female and in no distress Mental status - normal mood, behavior,  speech, dress, motor activity, and thought processes Neck - supple, no significant adenopathy Chest - clear to auscultation, no wheezes, rales or rhonchi, symmetric air entry Heart - normal rate, regular rhythm, normal S1, S2, no murmurs, rubs, clicks or gallops Extremities - peripheral pulses normal, no pedal edema, no clubbing or cyanosis      Latest Ref Rng & Units 05/28/2023    5:09 PM 05/28/2023    5:07 PM 11/18/2020    2:34 PM  CMP  Glucose 70 - 99 mg/dL  79  75   BUN 6 - 20 mg/dL  9  10   Creatinine 8.65 - 1.00 mg/dL  7.84  6.96   Sodium 295 - 145 mmol/L  140  140   Potassium 3.5 - 5.1 mmol/L  3.6  4.3   Chloride 98 - 111 mmol/L  105  102   CO2 22 - 32 mmol/L  26    Calcium 8.9 - 10.3 mg/dL  8.8  9.5   Total  Protein 6.5 - 8.1 g/dL 7.5   7.2   Total Bilirubin 0.3 - 1.2 mg/dL 0.7   0.4   Alkaline Phos 38 - 126 U/L 61   72   AST 15 - 41 U/L 15   12   ALT 0 - 44 U/L 11      Lipid Panel     Component Value Date/Time   CHOL 148 02/16/2020 0933   TRIG 80 02/16/2020 0933   HDL 31 (L) 02/16/2020 0933   CHOLHDL 4.8 (H) 02/16/2020 0933   CHOLHDL 5.2 (H) 06/07/2015 1019   VLDL 33 (H) 06/07/2015 1019   LDLCALC 101 (H) 02/16/2020 0933    CBC    Component Value Date/Time   WBC 7.6 05/28/2023 1707   RBC 4.31 05/28/2023 1707   HGB 12.9 05/28/2023 1707   HGB 13.1 02/08/2020 1115   HCT 38.7 05/28/2023 1707   HCT 39.4 02/08/2020 1115   PLT 262 05/28/2023 1707   PLT 325 02/08/2020 1115   MCV 89.8 05/28/2023 1707   MCV 88 02/08/2020 1115   MCH 29.9 05/28/2023 1707   MCHC 33.3 05/28/2023 1707   RDW 13.9 05/28/2023 1707   RDW 15.3 02/08/2020 1115   LYMPHSABS 2.4 02/08/2020 1115   MONOABS 0.6 02/06/2020 0012   EOSABS 0.1 02/08/2020 1115   BASOSABS 0.0 02/08/2020 1115    ASSESSMENT AND PLAN:  1. Viral illness Resolving.  2. Essential hypertension Close to goal.  Continue amlodipine 10 mg daily. Encourage low-salt diet and meal planning.  Printed information given - amLODipine (NORVASC) 10 MG tablet; TAKE 1 TABLET BY MOUTH DAILY.  Dispense: 30 tablet; Refill: 1  3. Tobacco dependence Pt is current smoker. Patient advised to quit smoking. Discussed health risks associated with smoking including lung and other types of cancers, chronic lung diseases and CV risks.. Pt ready to give trail of quitting.   Discussed methods to help quit including quitting cold [place], use of NRT, Chantix and Bupropion.  Pt wanting to try: Nicotine patches.  We will start with the intermediate dose of 14 mg.  Went over with her how to use the patches. _3_ Minutes spent on counseling. F/U: Assess progress on follow-up visit  - nicotine (NICODERM CQ - DOSED IN MG/24 HOURS) 14 mg/24hr patch; Place 1 patch (14 mg  total) onto the skin daily.  Dispense: 28 patch; Refill: 0  4. Screening for colon cancer - Ambulatory referral to Gastroenterology  5. Influenza vaccination declined   6.  COVID-19 vaccination declined  There are no diagnoses linked to this encounter.   Patient was given the opportunity to ask questions.  Patient verbalized understanding of the plan and was able to repeat key elements of the plan.   This documentation was completed using Paediatric nurse.  Any transcriptional errors are unintentional.  Orders Placed This Encounter  Procedures   Ambulatory referral to Gastroenterology     Requested Prescriptions   Signed Prescriptions Disp Refills   nicotine (NICODERM CQ - DOSED IN MG/24 HOURS) 14 mg/24hr patch 28 patch 0    Sig: Place 1 patch (14 mg total) onto the skin daily.   amLODipine (NORVASC) 10 MG tablet 30 tablet 1    Sig: TAKE 1 TABLET BY MOUTH DAILY.    Return in about 6 weeks (around 07/12/2023) for PAP.  Jonah Blue, MD, FACP

## 2023-06-01 ENCOUNTER — Other Ambulatory Visit (HOSPITAL_COMMUNITY): Payer: Self-pay

## 2023-06-11 ENCOUNTER — Other Ambulatory Visit (HOSPITAL_COMMUNITY): Payer: Self-pay

## 2023-06-17 ENCOUNTER — Ambulatory Visit
Admission: EM | Admit: 2023-06-17 | Discharge: 2023-06-17 | Disposition: A | Discharge status: Home / Self Care | Payer: BLUE CROSS/BLUE SHIELD | Attending: Internal Medicine | Admitting: Internal Medicine

## 2023-06-17 ENCOUNTER — Ambulatory Visit: Payer: BLUE CROSS/BLUE SHIELD

## 2023-06-17 DIAGNOSIS — R0602 Shortness of breath: Secondary | ICD-10-CM

## 2023-06-17 DIAGNOSIS — J208 Acute bronchitis due to other specified organisms: Secondary | ICD-10-CM | POA: Diagnosis not present

## 2023-06-17 DIAGNOSIS — R0989 Other specified symptoms and signs involving the circulatory and respiratory systems: Secondary | ICD-10-CM | POA: Diagnosis not present

## 2023-06-17 DIAGNOSIS — J209 Acute bronchitis, unspecified: Secondary | ICD-10-CM

## 2023-06-17 DIAGNOSIS — F17200 Nicotine dependence, unspecified, uncomplicated: Secondary | ICD-10-CM

## 2023-06-17 DIAGNOSIS — I10 Essential (primary) hypertension: Secondary | ICD-10-CM

## 2023-06-17 MED ORDER — HYDROCHLOROTHIAZIDE 25 MG PO TABS
25.0000 mg | ORAL_TABLET | Freq: Every day | ORAL | 0 refills | Status: AC
Start: 2023-06-17 — End: ?

## 2023-06-17 MED ORDER — PROMETHAZINE-DM 6.25-15 MG/5ML PO SYRP
5.0000 mL | ORAL_SOLUTION | Freq: Three times a day (TID) | ORAL | 0 refills | Status: AC | PRN
Start: 2023-06-17 — End: ?

## 2023-06-17 MED ORDER — CETIRIZINE HCL 10 MG PO TABS
10.0000 mg | ORAL_TABLET | Freq: Every day | ORAL | 0 refills | Status: AC
Start: 2023-06-17 — End: ?

## 2023-06-17 MED ORDER — PREDNISONE 20 MG PO TABS
ORAL_TABLET | ORAL | 0 refills | Status: AC
Start: 2023-06-17 — End: ?

## 2023-06-17 NOTE — Discharge Instructions (Addendum)
Start taking hydrochlorothiazide 25mg  especially since we will be using a steroid, prednisone. Please continue efforts at no longer smoking. For now, while on prednisone, maintain the hydrochlorothiazide. Once you finish prednisone in 5 days, then stop the hydrochlorothiazide.   Xray report will be available tomorrow. I will try to follow up as soon as I can. For now, we will manage this for bronchitis but if you have pneumonia we will use antibiotics.

## 2023-06-17 NOTE — ED Provider Notes (Signed)
[facility]  Note:  This document was prepared using Conservation officer, historic buildings and may include unintentional dictation errors.  MRN: [id number] DOB: 05-14-1976  Subjective:   Victoria Carpenter is a 47 y.o. female presenting for 2-week history of persistent shortness of breath, coughing, chest congestion, productive cough.  Who was started on amlodipine 2 weeks ago.  Had hypertensive urgency/emergency.  Has been compliant with her medication.  Used to take hydrochlorothiazide at 25 mg.  She is establishing care with a new PCP.  She did have testing for COVID, strep.  She also had a chest x-ray at the end of September.  Chest x-ray was negative.  She was smoking 1/2ppd but has decreased significantly this month.  No history of pulmonary embolism, MI, stroke.  No current facility-administered medications for this encounter.  Current Outpatient Medications:    amLODipine (NORVASC) 10 MG tablet, TAKE 1 TABLET BY MOUTH DAILY., Disp: 30 tablet, Rfl: 1   fluticasone (FLONASE) 50 MCG/ACT nasal spray, Place 1 spray into both nostrils daily., Disp: 18.2 mL, Rfl: 2   loratadine (CLARITIN) 10 MG tablet, Take 1 tablet (10 mg total) by mouth daily., Disp: 30 tablet, Rfl: 0   nicotine (NICODERM CQ - DOSED IN MG/24 HOURS) 14 mg/24hr patch, Place 1 patch (14 mg total) onto the skin daily., Disp: 28 patch, Rfl: 0   No Known Allergies  Past Medical History:  Diagnosis Date   Hypertension    Vaginal delivery    x 2   Vaginitis due to Gardnerella vaginalis 2010     History reviewed. No pertinent surgical history.  Family History  Problem Relation Age of Onset   Hypertension Mother    Hypertension Father     Social History   Tobacco Use   Smoking status: Every Day    Current packs/day: 0.25    Types: Cigarettes   Smokeless tobacco: Never  Vaping Use   Vaping status: Never Used  Substance Use Topics   Alcohol use: No   Drug use: No    ROS   Objective:    Vitals: BP (!) 168/111 (BP Location: Left Arm)   Pulse 85   Temp 98 F (36.7 C) (Oral)   Resp (!) 22   LMP 05/18/2023 (Exact Date)   SpO2 94%   BP Readings from Last 3 Encounters:  06/17/23 (!) 168/111  05/31/23 128/83  05/28/23 (!) 174/98   Physical Exam Constitutional:      General: She is not in acute distress.    Appearance: Normal appearance. She is well-developed and normal weight. She is not ill-appearing, toxic-appearing or diaphoretic.  HENT:     Head: Normocephalic and atraumatic.     Right Ear: Tympanic membrane, ear canal and external ear normal. No drainage or tenderness. No middle ear effusion. There is no impacted cerumen. Tympanic membrane is not erythematous or bulging.     Left Ear: Tympanic membrane, ear canal and external ear normal. No drainage or tenderness.  No middle ear effusion. There is no impacted cerumen. Tympanic membrane is not erythematous or bulging.     Nose: Congestion present. No rhinorrhea.     Mouth/Throat:     Mouth: Mucous membranes are moist. No oral lesions.     Pharynx: No pharyngeal swelling, oropharyngeal exudate, posterior oropharyngeal erythema or uvula swelling.     Tonsils: No tonsillar exudate or tonsillar abscesses.  Eyes:     General: No scleral icterus.  Right eye: No discharge.        Left eye: No discharge.     Extraocular Movements: Extraocular movements intact.     Right eye: Normal extraocular motion.     Left eye: Normal extraocular motion.     Conjunctiva/sclera: Conjunctivae normal.  Cardiovascular:     Rate and Rhythm: Normal rate and regular rhythm.     Heart sounds: Normal heart sounds. No murmur heard.    No friction rub. No gallop.  Pulmonary:     Effort: Pulmonary effort is normal. No respiratory distress.     Breath sounds: No stridor. Rhonchi present. No wheezing or rales.  Chest:     Chest wall: No tenderness.  Musculoskeletal:     Cervical back: Normal range of motion and neck supple.   Lymphadenopathy:     Cervical: No cervical adenopathy.  Skin:    General: Skin is warm and dry.  Neurological:     General: No focal deficit present.     Mental Status: She is alert and oriented to person, place, and time.     Cranial Nerves: No cranial nerve deficit.     Motor: No weakness.     Coordination: Coordination normal.     Gait: Gait normal.     Comments: No facial asymmetry.  Negative Romberg and pronator drift.  Psychiatric:        Mood and Affect: Mood normal.        Behavior: Behavior normal.     Assessment and Plan :   PDMP not reviewed this encounter.  1. Acute bronchitis, unspecified organism   2. Shortness of breath   3. Chest congestion   4. Smoker   5. Essential hypertension    X-ray over-read was pending at time of discharge, recommended follow up with only abnormal results. Otherwise will not call for negative over-read. Patient was in agreement.  Recommended management for acute bronchitis with an oral prednisone course.  To offset the effects that could have on her blood pressure, recommend concurrent use of hydrochlorothiazide.  Maintain amlodipine.  Use supportive care otherwise.  Follow-up closely with her PCP.  If the x-ray does show pneumonia, recommend coverage with Augmentin and azithromycin.   Wallis Bamberg, [place] 06/17/23 1948

## 2023-06-17 NOTE — ED Triage Notes (Signed)
Pt reports she was a "walking  stroke" as her blood pressure 210/107 x  2 weeks ago. Reports shortness of breath, cough and chest congestion x 2 weeks. Reports she went on the ED 2 weeks ago by ambulance and tested negative for COVID, Strep. Pt started taking her amlodipine on 05/29/23.

## 2023-07-12 ENCOUNTER — Ambulatory Visit: Payer: BLUE CROSS/BLUE SHIELD | Admitting: Internal Medicine
